# Patient Record
Sex: Female | Born: 1975 | Race: White | Hispanic: No | Smoking: Former smoker
Health system: Southern US, Community
[De-identification: ages and names within clinical notes are randomized; demographics above are authoritative.]

## PROBLEM LIST (undated history)

## (undated) DIAGNOSIS — F419 Anxiety disorder, unspecified: Secondary | ICD-10-CM

## (undated) DIAGNOSIS — S0502XA Injury of conjunctiva and corneal abrasion without foreign body, left eye, initial encounter: Secondary | ICD-10-CM

## (undated) DIAGNOSIS — F32A Depression, unspecified: Secondary | ICD-10-CM

## (undated) DIAGNOSIS — Z889 Allergy status to unspecified drugs, medicaments and biological substances status: Secondary | ICD-10-CM

## (undated) DIAGNOSIS — Z87442 Personal history of urinary calculi: Secondary | ICD-10-CM

## (undated) DIAGNOSIS — F329 Major depressive disorder, single episode, unspecified: Secondary | ICD-10-CM

## (undated) DIAGNOSIS — Z975 Presence of (intrauterine) contraceptive device: Secondary | ICD-10-CM

---
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
## 2013-08-01 ENCOUNTER — Ambulatory Visit (INDEPENDENT_AMBULATORY_CARE_PROVIDER_SITE_OTHER): Payer: BC Managed Care – PPO | Admitting: General Surgery

## 2013-08-01 ENCOUNTER — Other Ambulatory Visit (INDEPENDENT_AMBULATORY_CARE_PROVIDER_SITE_OTHER): Payer: Self-pay | Admitting: General Surgery

## 2013-08-01 ENCOUNTER — Encounter (INDEPENDENT_AMBULATORY_CARE_PROVIDER_SITE_OTHER): Payer: Self-pay | Admitting: General Surgery

## 2013-08-01 VITALS — BP 132/80 | HR 78 | Temp 98.3°F | Resp 18 | Ht 65.0 in | Wt 322.0 lb

## 2013-08-01 DIAGNOSIS — Z6841 Body Mass Index (BMI) 40.0 and over, adult: Secondary | ICD-10-CM

## 2013-08-01 NOTE — Patient Instructions (Signed)
Congratulations on starting your journey to a healthier life! Over the next few weeks you will be undergoing tests (x-rays and labs) and seeing specialists to help evaluate you for weight loss surgery.  These tests and consultations with a psychologist and nutritionist are needed to prepare you for the lifestyle changes that lie ahead and are often required by insurance companies to approve you for surgery.   Pathway to Surgery:  Over the next few weeks -->Lab work -->Radiology tests   - Chest x-ray - make sure your lungs are normal before surgery  - Upper GI - you drink barium and pictures are taken as it travels down your  esophagus and into your stomach - looks for reflux and a hiatal hernia which may  need to repaired at the same time as your weight loss surgery  - Abdominal Ultrasound - looks at your gallbladder and liver  - Mammogram - up to date mammogram if you are a female -->EKG  -->Sleep study - if you are felt to be at high risk for obstructive sleep apnea -->H. Pylori breath test ([employer]) - you surgeon may order this test to see if you have  a bacteria (H pylori) in your stomach which makes you at higher risk to develop a  ulcer or inflammation of your stomach -->Nutrition consultation -->Psychologist consultation -->Other specialist consults - your surgeon may determine that you need to see a  specialist like a cardiologist or pulmnologist depending on your health history -->Watch EMMI video about your planned weight loss surgery -->you can look at [email or web address] to learn more about weight loss surgery and  compare surgery outcomes -->you can look at our new website - [email or web address] - available mid-April 2015  Two weeks prior to surgery  Go on the extremely low carb liquid diet - this will decrease the size of your liver  which will make surgery safer - the nutritionist will go over this at a later date  Attend preoperative appointment with your surgeon  Attend  preoperative surgery class  One week prior to surgery  No aspirin products.  Tylenol is acceptable   24 hours prior to surgery  No alcoholic beverages  Report fever greater than 100.5 or excessive nasal drainage suggesting infection  Continue bariatric preop diet  Perform bowel prep if ordered  Do not eat or drink anything after midnight the night before surgery  Do not take any medications except those instructed by the anesthesiologist  Morning of surgery  Please arrive at the hospital at least 2 hours before your scheduled surgery time.  No makeup, fingernail polish or jewelry  Bring insurance cards with you  Bring your CPAP mask if you use this  

## 2013-08-01 NOTE — Progress Notes (Signed)
Patient ID: Sherry Gordon, female   DOB: 11/03/1975, 38 y.o.   MRN: [id number]  Chief Complaint  Patient presents with  . New Evaluation    new bariatric-intial consult for gastric lap band    HPI Sherry Gordon is a 38 y.o. female.   HPI 38 year old morbidly obese Caucasian female referred by Dr. Tarri Fuller for evaluation of weight loss surgery. She is specifically interested in laparoscopic adjustable gastric band surgery. She states that lab band surgery seems more organic and is not extreme as gastric bypass or sleeve. She has had friends who have had both procedures. And she feels she would do better with a lap band.  She states that she has struggled with her weight her entire life. Obesity is a problem in her family. Despite numerous attempts for sustained weight loss she has been unsuccessful. She has tried Weight Watchers off and on for over 10 years as well as Slim fast-all without any long-term success.  Her comorbidities include depression  Her paternal grandmother passed away from breast cancer. Her father had interstitial lung disease. Her sister has thyroid issues.  History reviewed. No pertinent past medical history.  Past Surgical History  Procedure Laterality Date  . Tonsillectomy  1985    History reviewed. No pertinent family history.  Social History History  Substance Use Topics  . Smoking status: Never Smoker   . Smokeless tobacco: Never Used  . Alcohol Use: Yes     Comment: occ    No Known Allergies  Current Outpatient Prescriptions  Medication Sig Dispense Refill  . cetirizine (ZYRTEC) 10 MG tablet Take 10 mg by mouth daily.      . Pseudoephedrine HCl (SUDAFED 24 HOUR PO) Take by mouth.      . venlafaxine XR (EFFEXOR-XR) 75 MG 24 hr capsule        No current facility-administered medications for this visit.    Review of Systems Review of Systems  Constitutional: Negative for fever, activity change, appetite change and unexpected weight  change.  HENT: Negative for nosebleeds and trouble swallowing.   Eyes: Negative for photophobia and visual disturbance.  Respiratory: Negative for chest tightness and shortness of breath.        Gets some wheezing with allergies and has to use inhaler  Cardiovascular: Negative for chest pain and leg swelling.       Denies CP, SOB, orthopnea, PND, DOE  Gastrointestinal: Negative for nausea, vomiting, abdominal pain, diarrhea and constipation.       Occassional reflux  Genitourinary: Negative for dysuria and difficulty urinating.       G1P1; has IUD; remote h/o kidney stone  Musculoskeletal: Negative for arthralgias.  Skin: Negative for pallor and rash.  Neurological: Negative for dizziness, seizures, facial asymmetry and numbness.       Denies TIA and amaurosis fugax   Hematological: Negative for adenopathy. Does not bruise/bleed easily.  Psychiatric/Behavioral: Negative for behavioral problems and agitation.    Blood pressure 132/80, pulse 78, temperature 98.3 F (36.8 C), temperature source Oral, resp. rate 18, height 5\' 5"  (1.651 m), weight 322 lb (146.058 kg).  Physical Exam Physical Exam  Vitals reviewed. Constitutional: She is oriented to person, place, and time. She appears well-developed and well-nourished. No distress.  Morbidly obese  HENT:  Head: Normocephalic and atraumatic.  Right Ear: External ear normal.  Left Ear: External ear normal.  Eyes: Conjunctivae are normal. No scleral icterus.  Neck: Normal range of motion. Neck supple. No tracheal deviation present. No thyromegaly  present.  Cardiovascular: Normal rate and normal heart sounds.   Pulmonary/Chest: Effort normal and breath sounds normal. No stridor. No respiratory distress. She has no wheezes.  Abdominal: Soft. She exhibits no distension. There is no tenderness. There is no rebound and no guarding.  Musculoskeletal: She exhibits no edema and no tenderness.  Lymphadenopathy:    She has no cervical  adenopathy.  Neurological: She is alert and oriented to person, place, and time. She exhibits normal muscle tone.  Skin: Skin is warm and dry. No rash noted. She is not diaphoretic. No erythema.  Psychiatric: She has a normal mood and affect. Her behavior is normal. Judgment and thought content normal.    Data Reviewed epworth sleepiness scale score 4  Assessment    Morbid obesity BMI 53.58 Depression Asthma     Plan    The patient meets weight loss surgery criteria. I think the patient would be an acceptable candidate for Laparoscopic adjustable gastric band placement.  We discussed laparoscopic adjustable gastric banding. The patient was given Agricultural engineereducational material. We discussed the risk and benefits of surgery including but not limited to bleeding, infection, injury to surrounding structures, blood clot formation such as deep venous thrombosis or pulmonary embolism, need to convert to an open procedure, band slippage, band erosion, failure to loose weight, port complications (leak or flippage), potential need for reoperative surgery, esophageal dilatation, worsening reflux, and vitamin deficiencies. We discussed the typical post operative recovery course. We discussed that their postoperative diet will be modified for several weeks. We specifically talked about the need to be on a liquid diet for one to 2 weeks after surgery. We also discussed the typical postoperative course with a laparoscopic adjustable gastric band and the need for frequent postoperative visits to assess the volume status of the band.  We discussed the typical expected weight loss with a laparoscopic adjustable gastric band. I explained to the patient that they can expect to lose 40-60% of their excess body weight if they are compliant with their postoperative instructions. However I did explain that some patients loose less than 40% and some patients lose more than 60% of their excess body weight.  I explained that the  likelihood of improvement in their obesity is good.  I explained to the patient that we will start our evaluation process which includes labs, Upper GI to evaluate stomach and swallowing anatomy, nutritionist consultation, psychiatrist consultation, EKG, CXR, abdominal ultrasound  She was given access to watch EMMI video to watch on LapBand surgery  Mary Sellaric M. Andrey CampanileWilson, MD, FACS General, Bariatric, & Minimally Invasive Surgery [facility] [place]        [facility]ILSON,Sadira Standard M 08/01/2013, 5:35 PM

## 2013-08-27 ENCOUNTER — Ambulatory Visit (HOSPITAL_COMMUNITY)
Admission: RE | Admit: 2013-08-27 | Discharge: 2013-08-27 | Disposition: A | Discharge status: Home / Self Care | Payer: BC Managed Care – PPO | Source: Ambulatory Visit | Attending: General Surgery | Admitting: General Surgery

## 2013-08-27 ENCOUNTER — Other Ambulatory Visit: Payer: Self-pay

## 2013-08-27 ENCOUNTER — Encounter (HOSPITAL_COMMUNITY)
Admission: RE | Admit: 2013-08-27 | Discharge: 2013-08-27 | Disposition: A | Discharge status: Home / Self Care | Payer: BC Managed Care – PPO | Source: Ambulatory Visit | Attending: General Surgery | Admitting: General Surgery

## 2013-08-27 DIAGNOSIS — F329 Major depressive disorder, single episode, unspecified: Secondary | ICD-10-CM | POA: Insufficient documentation

## 2013-08-27 DIAGNOSIS — Z6841 Body Mass Index (BMI) 40.0 and over, adult: Secondary | ICD-10-CM | POA: Insufficient documentation

## 2013-08-27 DIAGNOSIS — J45909 Unspecified asthma, uncomplicated: Secondary | ICD-10-CM | POA: Insufficient documentation

## 2013-08-27 DIAGNOSIS — Z803 Family history of malignant neoplasm of breast: Secondary | ICD-10-CM | POA: Insufficient documentation

## 2013-08-27 DIAGNOSIS — R935 Abnormal findings on diagnostic imaging of other abdominal regions, including retroperitoneum: Secondary | ICD-10-CM | POA: Insufficient documentation

## 2013-08-27 DIAGNOSIS — F3289 Other specified depressive episodes: Secondary | ICD-10-CM | POA: Insufficient documentation

## 2013-08-27 DIAGNOSIS — K219 Gastro-esophageal reflux disease without esophagitis: Secondary | ICD-10-CM | POA: Insufficient documentation

## 2013-09-07 ENCOUNTER — Encounter: Payer: BC Managed Care – PPO | Attending: General Surgery | Admitting: Dietician

## 2013-09-07 ENCOUNTER — Encounter: Payer: Self-pay | Admitting: Dietician

## 2013-09-07 VITALS — Ht 65.0 in | Wt 317.8 lb

## 2013-09-07 DIAGNOSIS — Z713 Dietary counseling and surveillance: Secondary | ICD-10-CM | POA: Insufficient documentation

## 2013-09-07 DIAGNOSIS — Z6841 Body Mass Index (BMI) 40.0 and over, adult: Secondary | ICD-10-CM

## 2013-09-07 DIAGNOSIS — Z01818 Encounter for other preprocedural examination: Secondary | ICD-10-CM | POA: Insufficient documentation

## 2013-09-07 NOTE — Progress Notes (Signed)
  Pre-Op Assessment Visit:  Pre-Operative LAGB Surgery  Medical Nutrition Therapy:  Appt start time: 1400   End time:  1445  Patient was seen on 09/07/2013 for Pre-Operative LAGB Nutrition Assessment. Assessment and [facility] on 09/07/2013.   Preferred Learning Style:   No preference indicated   Learning Readiness:   Ready  Handouts given during visit include:  Pre-Op Goals Bariatric Surgery Protein Shakes Bariatric fast food guide  Teaching Method Utilized:  Visual Auditory Hands on  Barriers to learning/adherence to lifestyle change: none  Demonstrated degree of understanding via:  Teach Back   Patient to call the [facility] to enroll in Pre-Op and Post-Op Nutrition Education when surgery date is scheduled.

## 2013-11-06 ENCOUNTER — Other Ambulatory Visit (INDEPENDENT_AMBULATORY_CARE_PROVIDER_SITE_OTHER): Payer: Self-pay | Admitting: General Surgery

## 2013-11-07 LAB — COMPREHENSIVE METABOLIC PANEL
ALBUMIN: 4.1 g/dL (ref 3.5–5.2)
ALK PHOS: 46 U/L (ref 39–117)
ALT: 22 U/L (ref 0–35)
AST: 18 U/L (ref 0–37)
BILIRUBIN TOTAL: 0.6 mg/dL (ref 0.2–1.2)
BUN: 13 mg/dL (ref 6–23)
CO2: 27 mEq/L (ref 19–32)
Calcium: 9.1 mg/dL (ref 8.4–10.5)
Chloride: 102 mEq/L (ref 96–112)
Creat: 0.86 mg/dL (ref 0.50–1.10)
Glucose, Bld: 146 mg/dL — ABNORMAL HIGH (ref 70–99)
POTASSIUM: 4.2 meq/L (ref 3.5–5.3)
Sodium: 138 mEq/L (ref 135–145)
Total Protein: 6.7 g/dL (ref 6.0–8.3)

## 2013-11-07 LAB — LIPID PANEL
Cholesterol: 195 mg/dL (ref 0–200)
HDL: 49 mg/dL (ref >39)
LDL Cholesterol: 120 mg/dL — ABNORMAL HIGH (ref 0–99)
Total CHOL/HDL Ratio: 4 Ratio
Triglycerides: 128 mg/dL (ref <150)
VLDL: 26 mg/dL (ref 0–40)

## 2013-11-07 LAB — CBC
HCT: 39.4 % (ref 36.0–46.0)
Hemoglobin: 14.2 g/dL (ref 12.0–15.0)
MCH: 29.9 pg (ref 26.0–34.0)
MCHC: 36 g/dL (ref 30.0–36.0)
MCV: 82.9 fL (ref 78.0–100.0)
PLATELETS: 238 10*3/uL (ref 150–400)
RBC: 4.75 MIL/uL (ref 3.87–5.11)
RDW: 13.6 % (ref 11.5–15.5)
WBC: 7 10*3/uL (ref 4.0–10.5)

## 2013-11-08 LAB — HCG, SERUM, QUALITATIVE: PREG SERUM: NEGATIVE

## 2013-11-08 LAB — TSH: TSH: 4.112 u[IU]/mL (ref 0.350–4.500)

## 2013-11-08 LAB — T4: T4 TOTAL: 10 ug/dL (ref 5.0–12.5)

## 2013-11-08 LAB — H. PYLORI ANTIBODY, IGG: H PYLORI IGG: 0.58 {ISR}

## 2013-11-18 ENCOUNTER — Encounter: Payer: BC Managed Care – PPO | Attending: General Surgery

## 2013-11-18 VITALS — Ht 65.0 in | Wt 321.0 lb

## 2013-11-18 DIAGNOSIS — Z713 Dietary counseling and surveillance: Secondary | ICD-10-CM | POA: Insufficient documentation

## 2013-11-18 DIAGNOSIS — Z01818 Encounter for other preprocedural examination: Secondary | ICD-10-CM | POA: Insufficient documentation

## 2013-11-18 DIAGNOSIS — Z6841 Body Mass Index (BMI) 40.0 and over, adult: Secondary | ICD-10-CM

## 2013-11-19 NOTE — Progress Notes (Signed)
  Pre-Operative Nutrition Class:  Appt start time: 830   End time:  1030.  Patient was seen on 11/18/2013 for Pre-Operative Bariatric Surgery Education at the [facility].   Surgery date:  Surgery type: LAGB Start weight at [facility]: 317.5 lbs on 09/07/13 Weight today: 321 lbs  TANITA  BODY COMP RESULTS  11/18/13   BMI (kg/m^2) 53.4   Fat Mass (lbs) 179.5   Fat Free Mass (lbs) 141.5   Total Body Water (lbs) 103.5   Samples given per MNT protocol. Patient educated on appropriate usage:  Premier protein shake (chocolate - qty 1)  Lot #: [id number]  Exp: 08/2014   Bariatric Advantage Calcium Citrate (Cinnamon - qty 1)  Lot #: [id number]  Exp: 11/2013   Bariactiv Multivitamin (qty 1)  Lot #: [id number]  Exp: 08/2014   Unjury protein powder (strawberry - qty 1)  Lot #: [id number]  Exp: 12/2014  The following the learning objectives were met by the patient during this course:  Identify Pre-Op Dietary Goals and will begin 2 weeks pre-operatively  Identify appropriate sources of fluids and proteins   State protein recommendations and appropriate sources pre and post-operatively  Identify Post-Operative Dietary Goals and will follow for 2 weeks post-operatively  Identify appropriate multivitamin and calcium sources  Describe the need for physical activity post-operatively and will follow MD recommendations  State when to call healthcare provider regarding medication questions or post-operative complications  Handouts given during class include:  Pre-Op Bariatric Surgery Diet Handout  Protein Shake Handout  Post-Op Bariatric Surgery Nutrition Handout  BELT Program Information Flyer  Support Group Information Flyer  [facility] Bariatric Supplements Price List  Follow-Up Plan: Patient will [facility]-up at [facility] 2 weeks post operatively for diet advancement per MD.

## 2013-11-20 ENCOUNTER — Other Ambulatory Visit (INDEPENDENT_AMBULATORY_CARE_PROVIDER_SITE_OTHER): Payer: Self-pay | Admitting: General Surgery

## 2013-11-20 ENCOUNTER — Telehealth (INDEPENDENT_AMBULATORY_CARE_PROVIDER_SITE_OTHER): Payer: Self-pay | Admitting: General Surgery

## 2013-11-20 NOTE — Telephone Encounter (Signed)
LMOM to let her know that she has a pre-op apt with Dr. Andrey CampanileWilson on 11-27-13 @ 12;00 and her 1st post op apt is on 12/27/2013. Card as been mailed to patient home

## 2013-11-25 ENCOUNTER — Encounter (HOSPITAL_COMMUNITY): Payer: Self-pay | Admitting: Pharmacy Technician

## 2013-11-26 ENCOUNTER — Encounter (HOSPITAL_COMMUNITY)
Admission: RE | Admit: 2013-11-26 | Discharge: 2013-11-26 | Disposition: A | Discharge status: Home / Self Care | Payer: BC Managed Care – PPO | Source: Ambulatory Visit | Attending: General Surgery | Admitting: General Surgery

## 2013-11-26 ENCOUNTER — Encounter (HOSPITAL_COMMUNITY): Payer: Self-pay

## 2013-11-26 DIAGNOSIS — Z01812 Encounter for preprocedural laboratory examination: Secondary | ICD-10-CM | POA: Insufficient documentation

## 2013-11-26 DIAGNOSIS — Z01818 Encounter for other preprocedural examination: Secondary | ICD-10-CM | POA: Insufficient documentation

## 2013-11-26 HISTORY — DX: Presence of (intrauterine) contraceptive device: Z97.5

## 2013-11-26 HISTORY — DX: Major depressive disorder, single episode, unspecified: F32.9

## 2013-11-26 HISTORY — DX: Depression, unspecified: F32.A

## 2013-11-26 HISTORY — DX: Injury of conjunctiva and corneal abrasion without foreign body, left eye, initial encounter: S05.02XA

## 2013-11-26 HISTORY — DX: Personal history of urinary calculi: Z87.442

## 2013-11-26 HISTORY — DX: Allergy status to unspecified drugs, medicaments and biological substances: Z88.9

## 2013-11-26 HISTORY — DX: Anxiety disorder, unspecified: F41.9

## 2013-11-26 LAB — CBC WITH DIFFERENTIAL/PLATELET
BASOS PCT: 1 % (ref 0–1)
Basophils Absolute: 0.1 10*3/uL (ref 0.0–0.1)
Eosinophils Absolute: 0.2 10*3/uL (ref 0.0–0.7)
Eosinophils Relative: 2 % (ref 0–5)
HEMATOCRIT: 41.3 % (ref 36.0–46.0)
Hemoglobin: 14.2 g/dL (ref 12.0–15.0)
LYMPHS PCT: 38 % (ref 12–46)
Lymphs Abs: 3.2 10*3/uL (ref 0.7–4.0)
MCH: 29.8 pg (ref 26.0–34.0)
MCHC: 34.4 g/dL (ref 30.0–36.0)
MCV: 86.8 fL (ref 78.0–100.0)
Monocytes Absolute: 0.6 10*3/uL (ref 0.1–1.0)
Monocytes Relative: 7 % (ref 3–12)
NEUTROS PCT: 52 % (ref 43–77)
Neutro Abs: 4.3 10*3/uL (ref 1.7–7.7)
PLATELETS: 247 10*3/uL (ref 150–400)
RBC: 4.76 MIL/uL (ref 3.87–5.11)
RDW: 12.3 % (ref 11.5–15.5)
WBC: 8.3 10*3/uL (ref 4.0–10.5)

## 2013-11-26 LAB — COMPREHENSIVE METABOLIC PANEL
ALBUMIN: 4 g/dL (ref 3.5–5.2)
ALK PHOS: 50 U/L (ref 39–117)
ALT: 49 U/L — AB (ref 0–35)
AST: 42 U/L — AB (ref 0–37)
Anion gap: 13 (ref 5–15)
BILIRUBIN TOTAL: 0.4 mg/dL (ref 0.3–1.2)
BUN: 19 mg/dL (ref 6–23)
CO2: 27 meq/L (ref 19–32)
CREATININE: 0.88 mg/dL (ref 0.50–1.10)
Calcium: 10.1 mg/dL (ref 8.4–10.5)
Chloride: 99 mEq/L (ref 96–112)
GFR calc Af Amer: >90 mL/min (ref >90)
GFR, EST NON AFRICAN AMERICAN: 84 mL/min — AB (ref >90)
Glucose, Bld: 86 mg/dL (ref 70–99)
POTASSIUM: 4.2 meq/L (ref 3.7–5.3)
SODIUM: 139 meq/L (ref 137–147)
Total Protein: 7.5 g/dL (ref 6.0–8.3)

## 2013-11-26 NOTE — Pre-Procedure Instructions (Signed)
11-26-13 EKG/ CXR 08-27-13 Epic.

## 2013-11-26 NOTE — Patient Instructions (Addendum)
20 Sherry Gordon  11/26/2013   Your procedure is scheduled on: 8-4  -2015 Tuesday at 0715 AM.  Enter through [facility] Entrance and follow signs to Short Stay Center. Arrive at   0515     AM.  Call this number if you have problems the morning of surgery: [phone]  Or Presurgical Testing [phone](Miasha Emmons) For Living Will and/or Health Care Power Attorney Forms: please provide copy for your medical record,may bring AM of surgery(Forms should be already notarized -we do not provide this service).(11-26-13 No- yes information preferred today and given).     Do not eat food:After Midnight.    Take these medicines the morning of surgery with A SIP OF WATER: Effexor. Cetirizine.   Do not wear jewelry, make-up or nail polish.  Do not wear lotions, powders, or perfumes. You may wear deodorant.  Do not shave 48 hours(2 days) prior to first CHG shower(legs and under arms).(Shaving face and neck okay.)  Do not bring valuables to the hospital.(Hospital is not responsible for lost valuables).  Contacts, dentures or removable bridgework, body piercing, hair pins may not be worn into surgery.  Leave suitcase in the car. After surgery it may be brought to your room.  For patients admitted to the hospital, checkout time is 11:00 AM the day of discharge.(Restricted visitors-Any Persons displaying flu-like symptoms or illness).    Patients discharged the day of surgery will not be allowed to drive home. Must have responsible person with you x 24 hours once discharged.  Name and phone number of your driver: Larose Hires [phone] cell  Special Instructions: CHG(Chlorhedine 4%-"Hibiclens","Betasept","Aplicare") Shower Use Special Wash: see special instructions.(avoid face and genitals)       ----------------------------------------------------------[facility] - Preparing for Surgery Before surgery, you can play an important role.  Because skin is not sterile, your skin needs to be as  free of germs as possible.  You can reduce the number of germs on your skin by washing with CHG (chlorahexidine gluconate) soap before surgery.  CHG is an antiseptic cleaner which kills germs and bonds with the skin to continue killing germs even after washing. Please DO NOT use if you have an allergy to CHG or antibacterial soaps.  If your skin becomes reddened/irritated stop using the CHG and inform your nurse when you arrive at Short Stay. Do not shave (including legs and underarms) for at least 48 hours prior to the first CHG shower.  You may shave your face/neck. Please follow these instructions carefully:  1.  Shower with CHG Soap the night before surgery and the  morning of Surgery.  2.  If you choose to wash your hair, wash your hair first as usual with your  normal  shampoo.  3.  After you shampoo, rinse your hair and body thoroughly to remove the  shampoo.                           4.  Use CHG as you would any other liquid soap.  You can apply chg directly  to the skin and wash                       Gently with a scrungie or clean washcloth.  5.  Apply the CHG Soap to your body ONLY FROM THE NECK DOWN.   Do not use on face/ open  Wound or open sores. Avoid contact with eyes, ears mouth and genitals (private parts).                       Wash face,  Genitals (private parts) with your normal soap.             6.  Wash thoroughly, paying special attention to the area where your surgery  will be performed.  7.  Thoroughly rinse your body with warm water from the neck down.  8.  DO NOT shower/wash with your normal soap after using and rinsing off  the CHG Soap.                9.  Pat yourself dry with a clean towel.            10.  Wear clean pajamas.            11.  Place clean sheets on your bed the night of your first shower and do not  sleep with pets. Day of Surgery : Do not apply any lotions/deodorants the morning of surgery.  Please wear clean clothes to the  hospital/surgery center.  FAILURE TO FOLLOW THESE INSTRUCTIONS MAY RESULT IN THE CANCELLATION OF YOUR SURGERY PATIENT SIGNATURE_________________________________  NURSE SIGNATURE__________________________________  ________________________________________________________________________

## 2013-11-27 ENCOUNTER — Ambulatory Visit (INDEPENDENT_AMBULATORY_CARE_PROVIDER_SITE_OTHER): Payer: BC Managed Care – PPO | Admitting: General Surgery

## 2013-11-27 ENCOUNTER — Encounter (INDEPENDENT_AMBULATORY_CARE_PROVIDER_SITE_OTHER): Payer: Self-pay | Admitting: General Surgery

## 2013-11-27 VITALS — BP 129/88 | HR 80 | Temp 98.5°F | Resp 17 | Ht 65.0 in | Wt 317.8 lb

## 2013-11-27 DIAGNOSIS — Z6841 Body Mass Index (BMI) 40.0 and over, adult: Secondary | ICD-10-CM

## 2013-11-27 MED ORDER — OXYCODONE HCL 5 MG/5ML PO SOLN: 5.0000 mg | ORAL | Status: DC | PRN

## 2013-11-27 NOTE — Patient Instructions (Signed)
Keep up with the preop diet - it is designed to shrink your liver Watch the educational videos Walk daily  google - covidien bariatrics   Two weeks prior to surgery  Go on the extremely low carb liquid diet - this will decrease the size of your liver  which will make surgery safer - the nutritionist will go over this at a later date  Attend preoperative appointment with your surgeon  Attend preoperative surgery class  One week prior to surgery  No aspirin products.  Tylenol is acceptable   24 hours prior to surgery  No alcoholic beverages  Report fever greater than 100.5 or excessive nasal drainage suggesting infection  Continue bariatric preop diet  Perform bowel prep if ordered  Do not eat or drink anything after midnight the night before surgery  Do not take any medications except those instructed by the anesthesiologist  Morning of surgery  Please arrive at the hospital at least 2 hours before your scheduled surgery time.  No makeup, fingernail polish or jewelry  Bring insurance cards with you  Bring your CPAP mask if you use this

## 2013-11-29 NOTE — Progress Notes (Signed)
Patient ID: Sherry Gordon, female   DOB: [birth year], 38 y.o.   MRN: [id number]  Chief Complaint  Patient presents with  . Bariatric Pre-op    HPI Sherry Gordon is a 38 y.o. female.   HPI 38 year old morbidly obese Caucasian female comes in for her preoperative appointment. I initially met her in April 2015. Her weight at that time was 322 pounds. She has completed her preoperative evaluation and has been approved for laparoscopic adjustable gastric band. She denies any changes to her medical history since she was seen in April. She has been walking in the evenings. she has been using the bariatricpal smartphone app.  Past Medical History  Diagnosis Date  . IUD (intrauterine device) in place     11-26-13 Mirena  . H/O seasonal allergies     carries an Inhaler, never used - uses OTC cetirizine  . Depression   . Anxiety   . History of kidney stones     x2 no recent problems  . Corneal abrasion, left     "corneal scatch" not a problem in last 6 months    Past Surgical History  Procedure Laterality Date  . Tonsillectomy  1985    age 70    History reviewed. No pertinent family history.  Social History History  Substance Use Topics  . Smoking status: Former Smoker -- 0.50 packs/day for 5 years    Quit date: 11/26/2008  . Smokeless tobacco: Never Used  . Alcohol Use: Yes     Comment: occ    No Known Allergies  Current Outpatient Prescriptions  Medication Sig Dispense Refill  . cetirizine (ZYRTEC) 10 MG tablet Take 10 mg by mouth daily.      Marland Kitchen venlafaxine XR (EFFEXOR-XR) 75 MG 24 hr capsule Take 75 mg by mouth every morning.       Marland Kitchen oxyCODONE (ROXICODONE) 5 MG/5ML solution Take 5-10 mLs (5-10 mg total) by mouth every 4 (four) hours as needed for severe pain.  200 mL  0   No current facility-administered medications for this visit.    Review of Systems Review of Systems  Constitutional: Negative for fever, activity change, appetite change and unexpected weight change.   HENT: Negative for nosebleeds and trouble swallowing.   Eyes: Negative for photophobia and visual disturbance.  Respiratory: Negative for chest tightness and shortness of breath.   Cardiovascular: Negative for chest pain and leg swelling.       Denies CP, SOB, orthopnea, PND, DOE  Gastrointestinal: Negative for nausea, abdominal pain, diarrhea, constipation and blood in stool.       Occasional reflux  Genitourinary: Negative for dysuria and difficulty urinating.  Musculoskeletal: Negative for arthralgias.  Skin: Negative for pallor and rash.  Neurological: Negative for dizziness, seizures, facial asymmetry and numbness.          Hematological: Negative for adenopathy. Does not bruise/bleed easily.  Psychiatric/Behavioral: Negative for behavioral problems and agitation.    Blood pressure 129/88, pulse 80, temperature 98.5 F (36.9 C), temperature source Oral, resp. rate 17, height 5' 5"  (1.651 m), weight 317 lb 12.8 oz (144.153 kg).  Physical Exam Physical Exam  Vitals reviewed. Constitutional: She is oriented to person, place, and time. She appears well-developed and well-nourished. No distress.  Morbidly obese  HENT:  Head: Normocephalic and atraumatic.  Right Ear: External ear normal.  Left Ear: External ear normal.  Eyes: Conjunctivae are normal. No scleral icterus.  Neck: Normal range of motion. Neck supple. No tracheal deviation present. No thyromegaly present.  Cardiovascular: Normal rate and normal heart sounds.   Pulmonary/Chest: Effort normal and breath sounds normal. No stridor. No respiratory distress. She has no wheezes.  Abdominal: Soft. She exhibits no distension. There is no tenderness. There is no rebound and no guarding.  Musculoskeletal: She exhibits no edema and no tenderness.  Lymphadenopathy:    She has no cervical adenopathy.  Neurological: She is alert and oriented to person, place, and time. She exhibits normal muscle tone.  Skin: Skin is warm and dry.  No rash noted. She is not diaphoretic. No erythema.  Psychiatric: She has a normal mood and affect. Her behavior is normal. Judgment and thought content normal.    Data Reviewed My office note Upper GI-small reflux Chest x-ray-within normal limits Abdominal ultrasound-hepatic steatosis bariatric surgery evaluation labs-fasting blood glucose level 146, LDL level 120  Assessment    Morbid obesity BMI 52.88 Depression Asthma Impaired fasting glucose Elevated LDL     Plan    We reviewed her preoperative workup. We discussed the finding of her blood work. She was allowed to view the video on the intraoperative steps placement of a laparoscopic adjustable gastric band. We discussed the typical hospital course as well as the typical postoperative course.  I stressed the importance of the preoperative diet. I encouraged her to continue to walk on a daily basis. She was given access to watch the EMMI video on postop LapBand.   Note: This dictation was prepared with Dragon/digital dictation along with [employer]. Any transcriptional errors that result from this process are unintentional.  Leighton Ruff. Redmond Pulling, MD, FACS General, Bariatric, & Minimally Invasive Surgery [facility], [place]        [facility] M 11/29/2013, 5:52 AM

## 2013-12-02 NOTE — Anesthesia Preprocedure Evaluation (Addendum)
Anesthesia Evaluation  Patient identified by MRN, date of birth, ID band Patient awake    Reviewed: Allergy & Precautions, H&P , NPO status , Patient's Chart, lab work & pertinent test results  Airway Mallampati: II TM Distance: >3 FB Neck ROM: full    Dental  (+) Caps, Dental Advisory Given Left upper front is capped:   Pulmonary neg pulmonary ROS, former smoker,  breath sounds clear to auscultation  Pulmonary exam normal       Cardiovascular Exercise Tolerance: Good negative cardio ROS  Rhythm:regular Rate:Normal     Neuro/Psych negative neurological ROS  negative psych ROS   GI/Hepatic negative GI ROS, Neg liver ROS,   Endo/Other  negative endocrine ROSMorbid obesity  Renal/GU negative Renal ROS  negative genitourinary   Musculoskeletal   Abdominal (+) + obese,   Peds  Hematology negative hematology ROS (+)   Anesthesia Other Findings   Reproductive/Obstetrics negative OB ROS                         Anesthesia Physical Anesthesia Plan  ASA: III  Anesthesia Plan: General   Post-op Pain Management:    Induction: Intravenous  Airway Management Planned: Oral ETT  Additional Equipment:   Intra-op Plan:   Post-operative Plan: Extubation in OR  Informed Consent: I have reviewed the patients History and Physical, chart, labs and discussed the procedure including the risks, benefits and alternatives for the proposed anesthesia with the patient or authorized representative who has indicated his/her understanding and acceptance.   Dental Advisory Given  Plan Discussed with: CRNA and Surgeon  Anesthesia Plan Comments:        Anesthesia Quick Evaluation

## 2013-12-03 ENCOUNTER — Encounter (HOSPITAL_COMMUNITY): Payer: Self-pay | Admitting: *Deleted

## 2013-12-03 ENCOUNTER — Encounter (HOSPITAL_COMMUNITY): Payer: BC Managed Care – PPO | Admitting: Anesthesiology

## 2013-12-03 ENCOUNTER — Encounter (HOSPITAL_COMMUNITY)
Admission: RE | Disposition: A | Discharge status: Home / Self Care | Payer: Self-pay | Source: Ambulatory Visit | Attending: General Surgery

## 2013-12-03 ENCOUNTER — Inpatient Hospital Stay (HOSPITAL_COMMUNITY): Payer: BC Managed Care – PPO | Admitting: Anesthesiology

## 2013-12-03 ENCOUNTER — Inpatient Hospital Stay (HOSPITAL_COMMUNITY): Payer: BC Managed Care – PPO

## 2013-12-03 ENCOUNTER — Ambulatory Visit (HOSPITAL_COMMUNITY)
Admission: RE | Admit: 2013-12-03 | Discharge: 2013-12-03 | Disposition: A | Discharge status: Home / Self Care | Payer: BC Managed Care – PPO | Source: Ambulatory Visit | Attending: General Surgery | Admitting: General Surgery

## 2013-12-03 DIAGNOSIS — Z6841 Body Mass Index (BMI) 40.0 and over, adult: Secondary | ICD-10-CM | POA: Insufficient documentation

## 2013-12-03 DIAGNOSIS — F329 Major depressive disorder, single episode, unspecified: Secondary | ICD-10-CM | POA: Insufficient documentation

## 2013-12-03 DIAGNOSIS — Z79899 Other long term (current) drug therapy: Secondary | ICD-10-CM | POA: Insufficient documentation

## 2013-12-03 DIAGNOSIS — J45909 Unspecified asthma, uncomplicated: Secondary | ICD-10-CM | POA: Insufficient documentation

## 2013-12-03 DIAGNOSIS — F3289 Other specified depressive episodes: Secondary | ICD-10-CM | POA: Insufficient documentation

## 2013-12-03 DIAGNOSIS — R7301 Impaired fasting glucose: Secondary | ICD-10-CM | POA: Insufficient documentation

## 2013-12-03 DIAGNOSIS — E7800 Pure hypercholesterolemia, unspecified: Secondary | ICD-10-CM | POA: Insufficient documentation

## 2013-12-03 DIAGNOSIS — Z87891 Personal history of nicotine dependence: Secondary | ICD-10-CM | POA: Insufficient documentation

## 2013-12-03 DIAGNOSIS — Z87442 Personal history of urinary calculi: Secondary | ICD-10-CM | POA: Insufficient documentation

## 2013-12-03 DIAGNOSIS — Z975 Presence of (intrauterine) contraceptive device: Secondary | ICD-10-CM | POA: Insufficient documentation

## 2013-12-03 HISTORY — PX: LAPAROSCOPIC GASTRIC BANDING: SHX1100

## 2013-12-03 LAB — PREGNANCY, URINE: PREG TEST UR: NEGATIVE

## 2013-12-03 SURGERY — GASTRIC BANDING, LAPAROSCOPIC
Anesthesia: General | Site: Abdomen

## 2013-12-03 MED ORDER — PROPOFOL 10 MG/ML IV BOLUS
INTRAVENOUS | Status: AC
  Filled 2013-12-03: qty 20

## 2013-12-03 MED ORDER — LACTATED RINGERS IV SOLN
INTRAVENOUS | Status: DC | PRN
  Administered 2013-12-03: 06:00:00 via INTRAVENOUS

## 2013-12-03 MED ORDER — SODIUM CHLORIDE 0.9 % IJ SOLN
INTRAMUSCULAR | Status: AC
  Filled 2013-12-03: qty 20

## 2013-12-03 MED ORDER — NEOSTIGMINE METHYLSULFATE 10 MG/10ML IV SOLN
INTRAVENOUS | Status: DC | PRN
  Administered 2013-12-03: 5 mg via INTRAVENOUS

## 2013-12-03 MED ORDER — MIDAZOLAM HCL 2 MG/2ML IJ SOLN
INTRAMUSCULAR | Status: AC
  Filled 2013-12-03: qty 2

## 2013-12-03 MED ORDER — LIDOCAINE HCL (CARDIAC) 20 MG/ML IV SOLN
INTRAVENOUS | Status: DC | PRN
  Administered 2013-12-03: 100 mg via INTRAVENOUS

## 2013-12-03 MED ORDER — MORPHINE SULFATE 10 MG/ML IJ SOLN: 2.0000 mg | INTRAMUSCULAR | Status: DC | PRN

## 2013-12-03 MED ORDER — ONDANSETRON HCL 4 MG/2ML IJ SOLN: 4.0000 mg | INTRAMUSCULAR | Status: DC | PRN

## 2013-12-03 MED ORDER — SODIUM CHLORIDE 0.9 % IJ SOLN
INTRAMUSCULAR | Status: DC | PRN
  Administered 2013-12-03: 20 mL

## 2013-12-03 MED ORDER — SUFENTANIL CITRATE 50 MCG/ML IV SOLN
INTRAVENOUS | Status: AC
  Filled 2013-12-03: qty 1

## 2013-12-03 MED ORDER — GLYCOPYRROLATE 0.2 MG/ML IJ SOLN
INTRAMUSCULAR | Status: AC
  Filled 2013-12-03: qty 3

## 2013-12-03 MED ORDER — CHLORHEXIDINE GLUCONATE 4 % EX LIQD: 60.0000 mL | Freq: Once | CUTANEOUS | Status: DC

## 2013-12-03 MED ORDER — DEXTROSE 5 % IV SOLN
INTRAVENOUS | Status: AC
  Filled 2013-12-03: qty 2

## 2013-12-03 MED ORDER — ACETAMINOPHEN 160 MG/5ML PO SOLN
325.0000 mg | ORAL | Status: DC | PRN
  Filled 2013-12-03: qty 20.3

## 2013-12-03 MED ORDER — BUPIVACAINE-EPINEPHRINE 0.25% -1:200000 IJ SOLN
INTRAMUSCULAR | Status: DC | PRN
  Administered 2013-12-03: 30 mL

## 2013-12-03 MED ORDER — HYDROMORPHONE HCL PF 1 MG/ML IJ SOLN
INTRAMUSCULAR | Status: AC
  Filled 2013-12-03: qty 1

## 2013-12-03 MED ORDER — BUPIVACAINE-EPINEPHRINE (PF) 0.25% -1:200000 IJ SOLN
INTRAMUSCULAR | Status: AC
  Filled 2013-12-03: qty 30

## 2013-12-03 MED ORDER — NEOSTIGMINE METHYLSULFATE 10 MG/10ML IV SOLN
INTRAVENOUS | Status: AC
  Filled 2013-12-03: qty 1

## 2013-12-03 MED ORDER — OXYCODONE HCL 5 MG/5ML PO SOLN: 5.0000 mg | ORAL | Status: DC | PRN

## 2013-12-03 MED ORDER — LIDOCAINE HCL (CARDIAC) 20 MG/ML IV SOLN
INTRAVENOUS | Status: AC
  Filled 2013-12-03: qty 5

## 2013-12-03 MED ORDER — POTASSIUM CHLORIDE IN NACL 20-0.9 MEQ/L-% IV SOLN: INTRAVENOUS | Status: DC

## 2013-12-03 MED ORDER — CISATRACURIUM BESYLATE 20 MG/10ML IV SOLN
INTRAVENOUS | Status: AC
  Filled 2013-12-03: qty 10

## 2013-12-03 MED ORDER — UNJURY VANILLA POWDER
2.0000 [oz_av] | Freq: Four times a day (QID) | ORAL | Status: DC
  Filled 2013-12-03 (×4): qty 27

## 2013-12-03 MED ORDER — DEXTROSE 5 % IV SOLN
2.0000 g | INTRAVENOUS | Status: AC
  Administered 2013-12-03: 2 g via INTRAVENOUS

## 2013-12-03 MED ORDER — MIDAZOLAM HCL 5 MG/5ML IJ SOLN
INTRAMUSCULAR | Status: DC | PRN
  Administered 2013-12-03: 2 mg via INTRAVENOUS

## 2013-12-03 MED ORDER — UNJURY CHOCOLATE CLASSIC POWDER
2.0000 [oz_av] | Freq: Four times a day (QID) | ORAL | Status: DC
  Filled 2013-12-03 (×4): qty 27

## 2013-12-03 MED ORDER — GLYCOPYRROLATE 0.2 MG/ML IJ SOLN
INTRAMUSCULAR | Status: DC | PRN
  Administered 2013-12-03: .8 mg via INTRAVENOUS

## 2013-12-03 MED ORDER — SUCCINYLCHOLINE CHLORIDE 20 MG/ML IJ SOLN
INTRAMUSCULAR | Status: DC | PRN
  Administered 2013-12-03: 100 mg via INTRAVENOUS

## 2013-12-03 MED ORDER — CISATRACURIUM BESYLATE (PF) 10 MG/5ML IV SOLN
INTRAVENOUS | Status: DC | PRN
  Administered 2013-12-03: 14 mg via INTRAVENOUS

## 2013-12-03 MED ORDER — LACTATED RINGERS IV SOLN
INTRAVENOUS | Status: DC | PRN
  Administered 2013-12-03: 1000 mL

## 2013-12-03 MED ORDER — LACTATED RINGERS IV SOLN
INTRAVENOUS | Status: DC
  Administered 2013-12-03: 10:00:00 via INTRAVENOUS

## 2013-12-03 MED ORDER — SODIUM CHLORIDE 0.9 % IJ SOLN
INTRAMUSCULAR | Status: AC
  Filled 2013-12-03: qty 10

## 2013-12-03 MED ORDER — UNJURY CHICKEN SOUP POWDER
2.0000 [oz_av] | Freq: Four times a day (QID) | ORAL | Status: DC
  Filled 2013-12-03 (×4): qty 27

## 2013-12-03 MED ORDER — HEPARIN SODIUM (PORCINE) 5000 UNIT/ML IJ SOLN
5000.0000 [IU] | INTRAMUSCULAR | Status: AC
  Administered 2013-12-03: 5000 [IU] via SUBCUTANEOUS
  Filled 2013-12-03: qty 1

## 2013-12-03 MED ORDER — SUFENTANIL CITRATE 50 MCG/ML IV SOLN
INTRAVENOUS | Status: DC | PRN
  Administered 2013-12-03: 5 ug via INTRAVENOUS
  Administered 2013-12-03: 10 ug via INTRAVENOUS
  Administered 2013-12-03: 5 ug via INTRAVENOUS
  Administered 2013-12-03: 10 ug via INTRAVENOUS

## 2013-12-03 MED ORDER — ACETAMINOPHEN 160 MG/5ML PO SOLN
650.0000 mg | ORAL | Status: DC | PRN
  Administered 2013-12-03: 650 mg via ORAL
  Filled 2013-12-03 (×2): qty 20.3

## 2013-12-03 MED ORDER — HYDROMORPHONE HCL PF 1 MG/ML IJ SOLN
0.2500 mg | INTRAMUSCULAR | Status: DC | PRN
  Administered 2013-12-03 (×2): 0.25 mg via INTRAVENOUS

## 2013-12-03 MED ORDER — PROPOFOL 10 MG/ML IV BOLUS
INTRAVENOUS | Status: DC | PRN
  Administered 2013-12-03: 200 mg via INTRAVENOUS

## 2013-12-03 MED ORDER — ONDANSETRON HCL 4 MG/2ML IJ SOLN
INTRAMUSCULAR | Status: DC | PRN
  Administered 2013-12-03: 4 mg via INTRAVENOUS

## 2013-12-03 MED ORDER — EPHEDRINE SULFATE 50 MG/ML IJ SOLN
INTRAMUSCULAR | Status: DC | PRN
  Administered 2013-12-03: 10 mg via INTRAVENOUS

## 2013-12-03 MED ORDER — ONDANSETRON HCL 4 MG/2ML IJ SOLN
INTRAMUSCULAR | Status: AC
  Filled 2013-12-03: qty 2

## 2013-12-03 SURGICAL SUPPLY — 57 items
BAND LAP 10.0 W/TUBES (Band) ×2 IMPLANT
BAND LAP 10.0CM W/TUBES (Band) ×1 IMPLANT
BENZOIN TINCTURE PRP APPL 2/3 (GAUZE/BANDAGES/DRESSINGS) IMPLANT
BLADE HEX COATED 2.75 (ELECTRODE) ×3 IMPLANT
BLADE SURG 15 STRL LF DISP TIS (BLADE) ×1 IMPLANT
BLADE SURG 15 STRL SS (BLADE) ×2
BLADE SURG SZ11 CARB STEEL (BLADE) ×3 IMPLANT
CANISTER SUCTION 2500CC (MISCELLANEOUS) IMPLANT
CHLORAPREP W/TINT 26ML (MISCELLANEOUS) ×3 IMPLANT
DECANTER SPIKE VIAL GLASS SM (MISCELLANEOUS) ×3 IMPLANT
DERMABOND ADVANCED (GAUZE/BANDAGES/DRESSINGS)
DERMABOND ADVANCED .7 DNX12 (GAUZE/BANDAGES/DRESSINGS) IMPLANT
DEVICE SUT QUICK LOAD TK 5 (STAPLE) ×6 IMPLANT
DEVICE SUT TI-KNOT TK 5X26 (MISCELLANEOUS) ×2 IMPLANT
DEVICE SUTURE ENDOST 10MM (ENDOMECHANICALS) IMPLANT
DEVICE TI KNOT TK5 (MISCELLANEOUS) ×1
DISSECTOR BLUNT TIP ENDO 5MM (MISCELLANEOUS) IMPLANT
DRAPE CAMERA CLOSED 9X96 (DRAPES) ×3 IMPLANT
DRAPE UTILITY XL STRL (DRAPES) ×6 IMPLANT
ELECT REM PT RETURN 9FT ADLT (ELECTROSURGICAL) ×3
ELECTRODE REM PT RTRN 9FT ADLT (ELECTROSURGICAL) ×1 IMPLANT
GLOVE BIO SURGEON STRL SZ7.5 (GLOVE) ×3 IMPLANT
GLOVE BIOGEL M STRL SZ7.5 (GLOVE) IMPLANT
GLOVE INDICATOR 8.0 STRL GRN (GLOVE) ×3 IMPLANT
GOWN STRL REUS W/TWL XL LVL3 (GOWN DISPOSABLE) ×9 IMPLANT
HOVERMATT SINGLE USE (MISCELLANEOUS) ×3 IMPLANT
KIT BASIN OR (CUSTOM PROCEDURE TRAY) ×3 IMPLANT
MESH HERNIA 1X4 RECT BARD (Mesh General) ×1 IMPLANT
MESH HERNIA BARD 1X4 (Mesh General) ×2 IMPLANT
NEEDLE SPNL 22GX3.5 QUINCKE BK (NEEDLE) ×3 IMPLANT
NS IRRIG 1000ML POUR BTL (IV SOLUTION) ×3 IMPLANT
PACK UNIVERSAL I (CUSTOM PROCEDURE TRAY) ×3 IMPLANT
PENCIL BUTTON HOLSTER BLD 10FT (ELECTRODE) ×3 IMPLANT
QUICK LOAD TK 5 (STAPLE) ×3
SET IRRIG TUBING LAPAROSCOPIC (IRRIGATION / IRRIGATOR) IMPLANT
SHEARS HARMONIC ACE PLUS 36CM (ENDOMECHANICALS) IMPLANT
SLEEVE XCEL OPT CAN 5 100 (ENDOMECHANICALS) ×6 IMPLANT
SOLUTION ANTI FOG 6CC (MISCELLANEOUS) ×3 IMPLANT
SPONGE LAP 18X18 X RAY DECT (DISPOSABLE) ×3 IMPLANT
STAPLER VISISTAT 35W (STAPLE) IMPLANT
SUT ETHIBOND 2 0 SH (SUTURE) ×6
SUT ETHIBOND 2 0 SH 36X2 (SUTURE) ×3 IMPLANT
SUT MNCRL AB 4-0 PS2 18 (SUTURE) ×3 IMPLANT
SUT PROLENE 2 0 CT2 30 (SUTURE) ×3 IMPLANT
SUT SILK 0 (SUTURE) ×2
SUT SILK 0 30XBRD TIE 6 (SUTURE) ×1 IMPLANT
SUT SURGIDAC NAB ES-9 0 48 120 (SUTURE) IMPLANT
SUT VIC AB 2-0 SH 27 (SUTURE) ×2
SUT VIC AB 2-0 SH 27X BRD (SUTURE) ×1 IMPLANT
SUT VIC AB 3-0 SH 27 (SUTURE) ×2
SUT VIC AB 3-0 SH 27X BRD (SUTURE) ×1 IMPLANT
SYRINGE 20CC LL (MISCELLANEOUS) ×6 IMPLANT
TOWEL OR 17X26 10 PK STRL BLUE (TOWEL DISPOSABLE) ×3 IMPLANT
TROCAR BLADELESS 15MM (ENDOMECHANICALS) ×3 IMPLANT
TROCAR BLADELESS OPT 5 100 (ENDOMECHANICALS) ×3 IMPLANT
TUBE CALIBRATION LAPBAND (TUBING) ×3 IMPLANT
TUBING INSUFFLATION 10FT LAP (TUBING) ×3 IMPLANT

## 2013-12-03 NOTE — Progress Notes (Addendum)
Up to recliner chair from stretcher post op gastric band. PAS intact. Pt practicing deep breathing with splinting.   1350  Patient ambulated around unit twice and tolerated well. Waiting on dietician to visit patient prior to discharge.

## 2013-12-03 NOTE — H&P (View-Only) (Signed)
Patient ID: Sherry Gordon, female   DOB: [birth year], 38 y.o.   MRN: [id number]  Chief Complaint  Patient presents with  . Bariatric Pre-op    HPI Sherry Gordon is a 38 y.o. female.   HPI 38 year old morbidly obese Caucasian female comes in for her preoperative appointment. I initially met her in April 2015. Her weight at that time was 322 pounds. She has completed her preoperative evaluation and has been approved for laparoscopic adjustable gastric band. She denies any changes to her medical history since she was seen in April. She has been walking in the evenings. she has been using the bariatricpal smartphone app.  Past Medical History  Diagnosis Date  . IUD (intrauterine device) in place     11-26-13 Mirena  . H/O seasonal allergies     carries an Inhaler, never used - uses OTC cetirizine  . Depression   . Anxiety   . History of kidney stones     x2 no recent problems  . Corneal abrasion, left     "corneal scatch" not a problem in last 6 months    Past Surgical History  Procedure Laterality Date  . Tonsillectomy  1985    age 78    History reviewed. No pertinent family history.  Social History History  Substance Use Topics  . Smoking status: Former Smoker -- 0.50 packs/day for 5 years    Quit date: 11/26/2008  . Smokeless tobacco: Never Used  . Alcohol Use: Yes     Comment: occ    No Known Allergies  Current Outpatient Prescriptions  Medication Sig Dispense Refill  . cetirizine (ZYRTEC) 10 MG tablet Take 10 mg by mouth daily.      Marland Kitchen venlafaxine XR (EFFEXOR-XR) 75 MG 24 hr capsule Take 75 mg by mouth every morning.       Marland Kitchen oxyCODONE (ROXICODONE) 5 MG/5ML solution Take 5-10 mLs (5-10 mg total) by mouth every 4 (four) hours as needed for severe pain.  200 mL  0   No current facility-administered medications for this visit.    Review of Systems Review of Systems  Constitutional: Negative for fever, activity change, appetite change and unexpected weight change.   HENT: Negative for nosebleeds and trouble swallowing.   Eyes: Negative for photophobia and visual disturbance.  Respiratory: Negative for chest tightness and shortness of breath.   Cardiovascular: Negative for chest pain and leg swelling.       Denies CP, SOB, orthopnea, PND, DOE  Gastrointestinal: Negative for nausea, abdominal pain, diarrhea, constipation and blood in stool.       Occasional reflux  Genitourinary: Negative for dysuria and difficulty urinating.  Musculoskeletal: Negative for arthralgias.  Skin: Negative for pallor and rash.  Neurological: Negative for dizziness, seizures, facial asymmetry and numbness.          Hematological: Negative for adenopathy. Does not bruise/bleed easily.  Psychiatric/Behavioral: Negative for behavioral problems and agitation.    Blood pressure 129/88, pulse 80, temperature 98.5 F (36.9 C), temperature source Oral, resp. rate 17, height 5' 5"  (1.651 m), weight 317 lb 12.8 oz (144.153 kg).  Physical Exam Physical Exam  Vitals reviewed. Constitutional: She is oriented to person, place, and time. She appears well-developed and well-nourished. No distress.  Morbidly obese  HENT:  Head: Normocephalic and atraumatic.  Right Ear: External ear normal.  Left Ear: External ear normal.  Eyes: Conjunctivae are normal. No scleral icterus.  Neck: Normal range of motion. Neck supple. No tracheal deviation present. No thyromegaly present.  Cardiovascular: Normal rate and normal heart sounds.   Pulmonary/Chest: Effort normal and breath sounds normal. No stridor. No respiratory distress. She has no wheezes.  Abdominal: Soft. She exhibits no distension. There is no tenderness. There is no rebound and no guarding.  Musculoskeletal: She exhibits no edema and no tenderness.  Lymphadenopathy:    She has no cervical adenopathy.  Neurological: She is alert and oriented to person, place, and time. She exhibits normal muscle tone.  Skin: Skin is warm and dry.  No rash noted. She is not diaphoretic. No erythema.  Psychiatric: She has a normal mood and affect. Her behavior is normal. Judgment and thought content normal.    Data Reviewed My office note Upper GI-small reflux Chest x-ray-within normal limits Abdominal ultrasound-hepatic steatosis bariatric surgery evaluation labs-fasting blood glucose level 146, LDL level 120  Assessment    Morbid obesity BMI 52.88 Depression Asthma Impaired fasting glucose Elevated LDL     Plan    We reviewed her preoperative workup. We discussed the finding of her blood work. She was allowed to view the video on the intraoperative steps placement of a laparoscopic adjustable gastric band. We discussed the typical hospital course as well as the typical postoperative course.  I stressed the importance of the preoperative diet. I encouraged her to continue to walk on a daily basis. She was given access to watch the EMMI video on postop LapBand.   Note: This dictation was prepared with Dragon/digital dictation along with [employer]. Any transcriptional errors that result from this process are unintentional.  Leighton Ruff. Redmond Pulling, MD, FACS General, Bariatric, & Minimally Invasive Surgery [facility], [place]        [facility] M 11/29/2013, 5:52 AM

## 2013-12-03 NOTE — Transfer of Care (Signed)
Immediate Anesthesia Transfer of Care Note  Patient: Sherry Gordon  Procedure(s) Performed: Procedure(s): LAPAROSCOPIC ADJUSTABLE GASTRIC BAND PLACEMENT (N/A)  Patient Location: PACU  Anesthesia Type:General  Level of Consciousness: awake, alert  and oriented  Airway & Oxygen Therapy: Patient Spontanous Breathing and Patient connected to face mask oxygen  Post-op Assessment: Report given to PACU RN and Post -op Vital signs reviewed and stable  Post vital signs: Reviewed and stable  Complications: No apparent anesthesia complications

## 2013-12-03 NOTE — Progress Notes (Signed)
Patient is alert and oriented.  Pain is controlled, and patient is tolerating fluids.  Plan to advance to protein shake today.  Reviewed Adjustable gastric band discharge instructions with patient, patient able to articulate understanding.  Provided information on BELT program, Support Group and [facility] outpatient pharmacy. All questions answered, will continue to monitor.  

## 2013-12-03 NOTE — Anesthesia Postprocedure Evaluation (Signed)
  Anesthesia Post-op Note  Patient: Sherry Gordon  Procedure(s) Performed: Procedure(s) (LRB): LAPAROSCOPIC ADJUSTABLE GASTRIC BAND PLACEMENT (N/A)  Patient Location: PACU  Anesthesia Type: General  Level of Consciousness: awake and alert   Airway and Oxygen Therapy: Patient Spontanous Breathing  Post-op Pain: mild  Post-op Assessment: Post-op Vital signs reviewed, Patient's Cardiovascular Status Stable, Respiratory Function Stable, Patent Airway and No signs of Nausea or vomiting  Last Vitals:  Filed Vitals:   12/03/13 1030  BP: 114/83  Pulse: 76  Temp: 36.7 C  Resp: 12    Post-op Vital Signs: stable   Complications: No apparent anesthesia complications

## 2013-12-03 NOTE — Discharge Instructions (Signed)
° °                ° °ADJUSTABLE GASTRIC BAND ° Home Care Instructions ° ° These instructions are to help you care for yourself when you go home. ° °Call: If you have any problems. °• Call [phone] and ask for the surgeon on call °• If you need immediate assistance come to the ER at [place]. Tell the ER staff you are a new post-op gastric banding patient  °Signs and symptoms to report: • Severe  vomiting or nausea °o If you cannot handle clear liquids for longer than 1 day, call your surgeon °• Abdominal pain which does not get better after taking your pain medication °• Fever greater than 100.4°  F and chills °• Heart rate over 100 beats a minute °• Trouble breathing °• Chest pain °• Redness,  swelling, drainage, or foul odor at incision (surgical) sites °• If your incisions open or pull apart °• Swelling or pain in calf (lower leg) °• Diarrhea (Loose bowel movements that happen often), frequent watery, uncontrolled bowel movements °• Constipation, (no bowel movements for 3 days) if this happens: °o Take Milk of Magnesia, 2 tablespoons by mouth, 3 times a day for 2 days if needed °o Stop taking Milk of Magnesia once you have had a bowel movement °o Call your doctor if constipation continues °Or °o Take Miralax  (instead of Milk of Magnesia) following the label instructions °o Stop taking Miralax once you have had a bowel movement °o Call your doctor if constipation continues °• Anything you think is “abnormal for you” °  °Normal side effects after surgery: • Unable to sleep at night or unable to concentrate °• Irritability °• Being tearful (crying) or depressed ° °These are common complaints, possibly related to your anesthesia, stress of surgery, and change in lifestyle, that usually go away a few weeks after surgery. If these feelings continue, call your medical doctor.  °Wound Care: You may have surgical glue, steri-strips, or staples over your incisions after surgery °• Surgical glue: Looks like clear  film over your incisions and will wear off a little at a time °• Steri-strips: Adhesive strips of tape over your incisions. You may notice a yellowish color on skin under the steri-strips. This is used to make the steri-strips stick better. Do not pull the steri-strips off - let them fall off °• Staples: Staples may be removed before you leave the hospital °o If you go home with staples, call [facility] for an appointment with your surgeon’s nurse to have staples removed 10 days after surgery, [phone] °• Showering: You may shower two (2) days after your surgery unless your surgeon tells you differently °o Wash gently around incisions with warm soapy water, rinse well, and gently pat dry °o If you have a drain (tube from your incision), you may need someone to hold this while you shower °o No tub baths until staples are removed and incisions are healed °  °Medications: • Medications should be liquid or crushed if larger than the size of a dime °• Extended release pills (medication that releases a little bit at a time through the  day) should not be crushed °• Depending on the size and number of medications you take, you may need to space (take a few throughout the day)/change the time you take your medications so that you do not over-fill your pouch (smaller stomach) °• Make sure you follow-up with you primary care physician   to make medication changes needed during rapid weight loss and life -style changes °• If you have diabetes, follow up with your doctor that orders your diabetes medication(s) within one week after surgery and check your blood sugar regularly ° °• Do not drive while taking narcotics (pain medications) ° °• Do not take acetaminophen (Tylenol) and Roxicet or Lortab Elixir at the same time since these pain medications contain acetaminophen °  °Diet:  °First 2 Weeks You will see the nutritionist about two (2) weeks after your surgery. The nutritionist will increase the types of  foods you can eat if you are handling liquids well: °• If you have severe vomiting or nausea and cannot handle clear liquids lasting longer than 1 day call your surgeon °For Same Day Surgery Discharge Patients: °• The day of surgery drink water only: 2 ounces every 4 hours °• If you are handling water, start drinking your high protein shake the next morning °For Overnight Stay Patients: °• Begin by drinking 2 ounces of a high protein every 3 hours, 5-6 times per day °• Slowly increase the amount you drink as tolerated °• You may find it easier to slowly sip shakes throughout the day. It is important to get your proteins in first °  ° Protein Shake °• Drink at least 2 ounces of shake 5-6 times per day °• Each serving of protein shakes (usually 8-12 ounces) should have a minimum of: °o 15 grams of protein °o And no more than 5 grams of carbohydrate °• Goal for protein each day: °o Men = 80 grams per day °o Women = 60 grams per day °• Protein powder may be added to fluids such as non-fat milk or Lactaid milk or Soy milk (limit to 35 grams added protein powder per serving) ° °Hydration °• Slowly increase the amount of water and other clear liquids as tolerated (See Acceptable Fluids) °• Slowly increase the amount of protein shake as tolerated °• Sip fluids slowly and throughout the day °• May use sugar substitutes in small amounts (no more than 6-8 packets per day; i.e. Splenda) ° °Fluid Goal °• The first goal is to drink at least 8 ounces of protein shake/drink per day (or as directed by the nutritionist); some examples of protein shakes are Syntrax, Nectar, Adkins Advantage, EAS Edge HP, and Unjury. - See handout from pre-op Bariatric Education Class: °o Slowly increase the amount of protein shake you drink as tolerated °o You may find it easier to slowly sip shakes throughout the day °o It is important to get your proteins in first °• Your fluid goal is to drink 64-100 ounces of fluid daily °o It may take a few weeks  to build up to this  °• 32 oz. (or more) should be full liquids (see below for examples) °• Liquids should not contain sugar, caffeine, or carbonation ° °Clear Liquids: °• Water of Sugar-free flavored water (i.e. Fruit H²O, Propel) °• Decaffeinated coffee or tea (sugar-free) °• Lakiyah lite, Wyler’s Lite, Minute Maid Lite °• Sugar-free Jell-O °• Bouillon or broth °• Sugar-free Popsicle:    - Less than 20 calories each; Limit 1 per day ° ° ° ° °  ° Full Liquids: °                  Protein Shakes/Drinks + 2 choices per day of other full liquids °• Full liquids must be: °o No More Than 12 grams of Carbs per serving °o No More Than 3 grams   of Fat per serving °• Strained low-fat cream soup °• Non-Fat milk °• Fat-free Lactaid Milk °• Sugar-free yogurt (Dannon Lite & Fit, Greek yogurt) °  °Vitamins and Minerals • Start 1 day after surgery unless otherwise directed by your surgeon °• 1 Chewable Multivitamin / Multimineral Supplement with iron (i.e. Centrum for Adults) °• Chewable Calcium Citrate with Vitamin D-3 °(Example: 3 Chewable Calcium  Plus 600 with vitamin D-3) °o Take 500 mg three (3) times a day for a total of 1500 mg per day °o Do not take all 3 doses of calcium at one time as it may cause constipation, and you can only absorb 500 mg at a time °o Do not mix multivitamins containing iron with calcium supplements;  take 2 hours apart °o Do not substitute Tums (calcium carbonate) for your calcium °• Menstruating women and those at risk for anemia ( a blood disease that causes weakness) may need extra iron °o Talk to your doctor to see if you need more iron °• If you need extra iron: total daily iron recommendation (including Vitamins) is 50 to 100 mg Iron/day °• Do not stop taking or change any vitamins or minerals until you talk to your nutritionist or surgeon °• Your nutritionist and/or surgeon must approve all vitamin and mineral supplements  °Activity and Exercise: It is important to continue walking at home.  Limit your physical activity as instructed by your doctor. During this time, use these guidelines: °• Do not lift anything greater than ten  (10) pounds for at least two (2) weeks °• Do not go back to work or drive until your surgeon says you can °• You may have sex when you feel comfortable °o It is VERY important for female patients to use a reliable birth control method; fertility often increase after surgery °o Do not get pregnant for at least 18 months °• Start exercising as soon as your doctor tells you that you can °o Make sure your doctor approves any physical activity °• Start with a simple walking program °• Walk 5-15 minutes each day, 7 days per week °• Slowly increase until you are walking 30-45 minutes per day °• Consider joining our BELT program. [phone] or email [email or web address] ° °  °Special Instructions  Things to remember: °• Free counseling is available for you and your family through collaboration between [employer] and [employer]. Please call [phone] and leave a message °• Use your CPAP when sleeping if this applies to you °• Consider buying a medical alert bracelet that says you had lap-band surgery °• You will likely have your first fill (fluid added to your band) 6 - 8 weeks after surgery °• [facility] has a free Bariatric Surgery Support Group that meets monthly, the 3rd Thursday, 6pm. [facility] Classrooms. You can see classes online at [email or web address] °• It is very important to keep all follow up appointments with your surgeon, nutritionist, primary care physician, and behavioral health practitioner °o After the first year, please follow up with your bariatric surgeon and nutritionist at least once a year in order to maintain best weight loss results °      °             [facility]:  [phone] ° °             [facility]: [phone] ° °             Bariatric Nurse Coordinator:   [phone] °  °   Adjustable Gastric Band Home Care Instructions  Rev. 06/2012                                                            ° °     Reviewed and Endorsed °                                                  by [facility] Patient Education Committee, Jan, 2014 ° °

## 2013-12-03 NOTE — Op Note (Signed)
Sherry Gordon [id number] [birth year] 12/03/2013  Laparoscopic Adjustable Gastric Band Placement Operative Note   Pre-operative Diagnosis:  Morbid obesity BMI 51  Depression  Asthma  Impaired fasting glucose  Elevated LDL    Post-operative Diagnosis: same  Surgeon: [name]   Assistants: Dr Jaclynn Guarneri  Anesthesia: General endotracheal anesthesia  ASA Class: 3   Anesthesia: General plus local  Indications: Morbid Obesity unresponsive to medical treatment.  Findings: AP-S; no hiatal hernia   Procedure Details  The patient was seen in the Holding Room. The risks, benefits, complications, treatment options, and expected outcomes were discussed with the patient. The possibilities of reaction to medication, pulmonary aspiration, perforation of viscus, bleeding, recurrent infection, the need for additional procedures, failure to diagnose a condition, and creating a complication requiring transfusion or operation were discussed with the patient. The patient concurred with the proposed plan, giving informed consent.   The patient was taken to Operating Room # 1 at [facility], identified as Sherry Gordon and the procedure verified as Laparoscopic Adjustable Gastric Band Placement. A Time Out was held and the above information confirmed.  Full general anesthesia was induced with orotracheal intubation.  The patient was prepped and draped in a supine position. Appropriate antibiotics were given intravenously.  A 1cm incision was made 2 fingerbreadths below the left subcostal margin . Opitview technique was used to gain entry to the abdominal cavity. A 5 mm blunt trocar was advanced under direct vision through the abdominal wall with a 0 degree scope. The abdomen was insufflated, the laparoscope introduced.  There were no untoward findings on diagnostic laparoscopy. Trocars were placed under direct vision in the following fashion: a 5mm trocar in the lateral right upper quadrant, a  15mm trocar in the right upper quadrant, a 5mm trocar in the high epigastrium, and one 5mm trocar slightly above and to the left of the umbilicus. The patient was placed in reverse trendelenburg position.  The [facility] liver retractor was then placed through the high epigastric trocar site and positioned to hold the liver.    there was no evidence of a hiatal hernia on UGI, no dimpling noticed today, and patient really has no reflux. The angle of His was identified and the left crus was dissected free.  Approximately 8cm below the angle of His on the lesser curvature, passing through the pars flaccida and preserving the vagus nerve, a blunt instrument was gently passed anterior to the right crus and behind the gastro-esophageal junction without difficulty. Care was taken to minimize posterior dissection in order to prevent a posterior slip.   A AP-Standard Lap Band was introduced into the abdominal cavity through the 15mm trocar and carried around the gastro-esophageal junction and locked onto itself. Three interrupted 2.0 Ethibond sutures (each secured with a titanium tie knot) were used to imbricate the anterior stomach to itself over the band to prevent anterior slippage.   The bowel was examined and there were no obvious lesions. Hemostasis was verified. The liver retractor was removed under direct visualization. There was no evidence of liver injury. The tubing from the band was brought out via the right upper quadrant 15mm trocar site. All trocars were then removed under direct vision. The skin incision was lengthened and a subcutaneous space was made to accommodate the port. A 1 inch square of vicryl mesh was anchored to the base of the port with 4 sutures. The port was attached to the tubing and then placed in the subcutaneous pocket.  The redundant tubing  was advanced back into the abdominal cavity. Inverted interrupted deep dermal sutures using a 2-0 vicryl were placed.   The wounds were heavily  irrigated. The skin incisions were closed with 4-0 monocryl. Dermabond was applied.   Instrument, sponge, and needle counts were correct prior to wound closure and at the conclusion of the case.          Complications:  None; patient tolerated the procedure well.                Condition: stable  Sherry SellaEric M. Andrey CampanileWilson, MD, FACS General, Bariatric, & Minimally Invasive Surgery [facility] [place]

## 2013-12-03 NOTE — Progress Notes (Signed)
Nutrition Education Note  Received consult for diet education per DROP protocol.   Discussed 2 week post op diet with pt. Emphasized that liquids must be non carbonated, non caffeinated, and sugar free. Fluid goals discussed. Pt to follow up with outpatient bariatric RD for further diet progression after 2 weeks. Multivitamins and minerals also reviewed. Teach back method used, pt expressed understanding, expect good compliance.  Pt very aware of 2 week post-op diet and has majority of diet items purchased at home already. Without any questions or concerns.    Diet: First 2 Weeks  You will see the nutritionist about two (2) weeks after your surgery. The nutritionist will increase the types of foods you can eat if you are handling liquids well:  If you have severe vomiting or nausea and cannot handle clear liquids lasting longer than 1 day, call your surgeon  Protein Shake  Drink at least 2 ounces of shake 5-6 times per day  Each serving of protein shakes (usually 8 - 12 ounces) should have a minimum of:  15 grams of protein  And no more than 5 grams of carbohydrate  Goal for protein each day:  Men = 80 grams per day  Women = 60 grams per day  Protein powder may be added to fluids such as non-fat milk or Lactaid milk or Soy milk (limit to 35 grams added protein powder per serving)   Hydration  Slowly increase the amount of water and other clear liquids as tolerated (See Acceptable Fluids)  Slowly increase the amount of protein shake as tolerated  Sip fluids slowly and throughout the day  May use sugar substitutes in small amounts (no more than 6 - 8 packets per day; i.e. Splenda)   Fluid Goal  The first goal is to drink at least 8 ounces of protein shake/drink per day (or as directed by the nutritionist); some examples of protein shakes are [employer]x Nectar, Dillard'sdkins Advantage, EAS Edge HP, and Unjury. See handout from pre-op Bariatric Education Class:  Slowly increase the amount of protein  shake you drink as tolerated  You may find it easier to slowly sip shakes throughout the day  It is important to get your proteins in first  Your fluid goal is to drink 64 - 100 ounces of fluid daily  It may take a few weeks to build up to this  32 oz (or more) should be clear liquids  And  32 oz (or more) should be full liquids (see below for examples)  Liquids should not contain sugar, caffeine, or carbonation   Clear Liquids:  Water or Sugar-free flavored water (i.e. Fruit H2O, Propel)  Decaffeinated coffee or tea (sugar-free)  Wilena Lite, Wyler's Lite, Minute Maid Lite  Sugar-free Jell-O  Bouillon or broth  Sugar-free Popsicle: *Less than 20 calories each; Limit 1 per day   Full Liquids:  Protein Shakes/Drinks + 2 choices per day of other full liquids  Full liquids must be:  No More Than 12 grams of Carbs per serving  No More Than 3 grams of Fat per serving  Strained low-fat cream soup  Non-Fat milk  Fat-free Lactaid Milk  Sugar-free yogurt (Dannon Lite & Fit, Greek yogurt)    CoyanosaHeather Christobal Morado MS, RD, [place] [phone] Pager [phone] Weekend/After Hours Pager

## 2013-12-03 NOTE — Interval H&P Note (Signed)
History and Physical Interval Note:  12/03/2013 6:57 AM  Sherry Gordon  has presented today for surgery, with the diagnosis of Morbid Obesity  The various methods of treatment have been discussed with the patient and family. After consideration of risks, benefits and other options for treatment, the patient has consented to  Procedure(s): LAPAROSCOPIC GASTRIC BANDING (N/A) as a surgical intervention .  The patient's history has been reviewed, patient examined, no change in status, stable for surgery.  I have reviewed the patient's chart and labs.  Questions were answered to the patient's satisfaction.    Mary SellaEric M. Andrey CampanileWilson, MD, FACS General, Bariatric, & Minimally Invasive Surgery [facility], [place]   [facility]claren Greater LansingWILSON,Gibran Veselka M

## 2013-12-04 ENCOUNTER — Telehealth (HOSPITAL_COMMUNITY): Payer: Self-pay

## 2013-12-04 ENCOUNTER — Other Ambulatory Visit (INDEPENDENT_AMBULATORY_CARE_PROVIDER_SITE_OTHER): Payer: Self-pay

## 2013-12-04 ENCOUNTER — Telehealth (INDEPENDENT_AMBULATORY_CARE_PROVIDER_SITE_OTHER): Payer: Self-pay

## 2013-12-04 ENCOUNTER — Encounter (HOSPITAL_COMMUNITY): Payer: Self-pay | Admitting: General Surgery

## 2013-12-04 DIAGNOSIS — R109 Unspecified abdominal pain: Secondary | ICD-10-CM

## 2013-12-04 MED ORDER — HYDROCODONE-ACETAMINOPHEN 5-325 MG PO TABS: 1.0000 | ORAL_TABLET | Freq: Four times a day (QID) | ORAL | Status: DC | PRN

## 2013-12-04 NOTE — Telephone Encounter (Signed)
Pt s/p lap gastric band on 12/03/13 by Dr Andrey CampanileWilson. Pt states that every time she has taken Oxycodone 5mg  solution she has had n/v. Pt would like to see if Dr Andrey CampanileWilson can call give her something else to take. Pt rates her pain a 4 at this time. Pt states that she has tried to take Tylenol with no relief. Informed pt that I would send Dr Andrey CampanileWilson a message and we would contact her as soon as we received a response. Pt verbalized understanding.

## 2013-12-04 NOTE — Telephone Encounter (Signed)
Called pt to see what pain meds that she has had before. Pt states that she can not remember but she thinks that she has taking Hydrocodone(Vicodin) before. Informed pt that this could possibly be in a pill form and she was fine with this. Informed pt that I would inform Dr Andrey CampanileWilson.

## 2013-12-04 NOTE — Telephone Encounter (Signed)
vicodin 5/325 1-2 tabs po q 4hrs prn pain, disp 40. If vicodin an issue, can use norco same dosage, tabs, frequency. Please ask an MD to sign script.   Mary SellaEric M. Andrey CampanileWilson, MD, FACS General, Bariatric, & Minimally Invasive Surgery [facility], [place]

## 2013-12-04 NOTE — Telephone Encounter (Signed)
Attempted to make DROP discharge phone call, but number is disconnected.  No other number listed in [facility], or Bariatric Lead tracker.  Skip EstimableLaurie Deaton, RN

## 2013-12-05 ENCOUNTER — Other Ambulatory Visit (INDEPENDENT_AMBULATORY_CARE_PROVIDER_SITE_OTHER): Payer: Self-pay

## 2013-12-05 ENCOUNTER — Telehealth (INDEPENDENT_AMBULATORY_CARE_PROVIDER_SITE_OTHER): Payer: Self-pay

## 2013-12-05 MED ORDER — ONDANSETRON HCL 4 MG PO TABS: 4.0000 mg | ORAL_TABLET | Freq: Four times a day (QID) | ORAL | Status: DC | PRN

## 2013-12-05 NOTE — Telephone Encounter (Signed)
Pt s/p lap gastric band on 12/03/13 by Dr Andrey CampanileWilson. Pt states that she has still been having nausea and dry heaves. Pt states that she has been able to do liquids and the shakes and keep those down. Informed pt that we would call in a rx for Zofran 4mg  1 tablet evry 6 hours as needed for nausea #15 per protocol. Advised pt that if the n/v continues or becomes worse to give the office a call back. Pt verbalized understanding

## 2013-12-17 ENCOUNTER — Encounter: Payer: BC Managed Care – PPO | Attending: General Surgery

## 2013-12-17 VITALS — Ht 65.0 in | Wt 304.0 lb

## 2013-12-17 DIAGNOSIS — Z713 Dietary counseling and surveillance: Secondary | ICD-10-CM | POA: Insufficient documentation

## 2013-12-17 DIAGNOSIS — Z01818 Encounter for other preprocedural examination: Secondary | ICD-10-CM | POA: Diagnosis not present

## 2013-12-17 DIAGNOSIS — Z6841 Body Mass Index (BMI) 40.0 and over, adult: Secondary | ICD-10-CM

## 2013-12-17 NOTE — Progress Notes (Signed)
Bariatric Class:  Appt start time: 1530 end time:  1630.  2 Week Post-Operative Nutrition Class  Patient was seen on 12/17/2013 for Post-Operative Nutrition [facility]  Surgery date:  Surgery type: LAGB Start weight at [facility]: 317.5 lbs on 09/07/13 Weight today: 304.0 lbs Weight change: 17 lbs  TANITA  BODY COMP RESULTS  11/18/13 12/17/13   BMI (kg/m^2) 53.4 50.6   Fat Mass (lbs) 179.5 158.0   Fat Free Mass (lbs) 141.5 146.0   Total Body Water (lbs) 103.5 107.0    The following the learning objectives were met by the patient during this course:  Identifies Phase 3A (Soft, High Proteins) Dietary Goals and will begin from 2 weeks post-operatively to 2 months post-operatively  Identifies appropriate sources of fluids and proteins   States protein recommendations and appropriate sources post-operatively  Identifies the need for appropriate texture modifications, mastication, and bite sizes when consuming solids  Identifies appropriate multivitamin and calcium sources post-operatively  Describes the need for physical activity post-operatively and will follow MD recommendations  States when to call healthcare provider regarding medication questions or post-operative complications  Handouts given during class include:  Phase 3A: Soft, High Protein Diet Handout  Follow-Up Plan: Patient will follow-up at [facility] in 4 weeks for 6 week post-op nutrition visit for diet advancement per MD.

## 2013-12-17 NOTE — Patient Instructions (Signed)
Patient to follow Phase 3A-Soft, High Protein Diet and [facility] for 6 week post-op nutrition visit for diet advancement.

## 2013-12-27 ENCOUNTER — Ambulatory Visit (INDEPENDENT_AMBULATORY_CARE_PROVIDER_SITE_OTHER): Payer: BC Managed Care – PPO | Admitting: General Surgery

## 2013-12-27 ENCOUNTER — Encounter (INDEPENDENT_AMBULATORY_CARE_PROVIDER_SITE_OTHER): Payer: Self-pay | Admitting: General Surgery

## 2013-12-27 VITALS — BP 126/78 | HR 73 | Temp 97.5°F | Ht 65.0 in | Wt 300.0 lb

## 2013-12-27 DIAGNOSIS — Z09 Encounter for follow-up examination after completed treatment for conditions other than malignant neoplasm: Secondary | ICD-10-CM

## 2013-12-27 DIAGNOSIS — Z9884 Bariatric surgery status: Secondary | ICD-10-CM

## 2013-12-27 NOTE — Patient Instructions (Signed)
1. Stay on liquids for the next 1- 2 days as you adapt to your new fill volume.  Then resume your previous diet. 2. Decreasing your carbohydrate intake will hasten you weight loss.  Rely more on proteins for your meals.  Avoid condiments that contain sweets such as Honey Mustard and sugary salad dressings.   3. Stay in the "green zone".  If you are regurgitating with meals, having night time reflux, and find yourself eating soft comfort foods (mashed potatoes, potato chips)...realize that you are developing "maladaptive eating".  You will not lose weight this way and may regain weight.  The GREEN ZONE is eating smaller portions and not regurgitating.  Hence we may need to withdraw fluid from your band. 4. Build exercise into your daily routine.  Walking is the best way to start but do something every day if you can.    Eating techniques 20-20-20 (30-30-30) 20 chews, 20 seconds between bites of food, 20 minutes to eat; sometimes you may need 30 chews, 30 seconds etc Use your nondominant hand to eat with Put fork down between bites of food Use a timer after swallowing to reinforce waiting 20-30 sec between bites of food Use a child/infant size utensil Try not to eat while watching TV  Watch EMMI video

## 2013-12-29 NOTE — Progress Notes (Signed)
Subjective:     Patient ID: Sherry Gordon, female   DOB: [birth year], 38 y.o.   MRN: [id number]  HPI 38 yo WF s/p LAGB 8/4 comes in for her first postop appt. She states she is doing well. She denies any nausea, vomiting, abd pain, reflux, regurgitation, constipation. She denies any difficulty eating or drinking. She reports ongoing hunger. She is walking everyday using different intensities for about 20 minutes. She reports no restriction.   Review of Systems     Objective:   Physical Exam BP 126/78  Pulse 73  Temp(Src) 97.5 F (36.4 C)  Ht  (1.651 m)  Wt 300 lb (136.079 kg)  BMI 49.92 kg/m2  See LapBand flowsheet  Gen: alert, NAD, non-toxic appearing HEENT: normocephalic, atraumatic; pupils equal, no scleral icterus, neck supple, Pulm: Lungs clear to auscultation, symmetric chest rise CV: regular rate and rhythm Abd: soft, nontender, nondistended. Well-healed trocar sites. No incisional hernia. Port is in right mid-abdomen Ext: no edema, normal,  Neuro: nonfocal, sensation grossly intact Psych: appropriate, judgment normal     Assessment:     S/p LAGB Depression Asthma Impaired fasting glucose     Plan:     Overall she appears to be doing well. No sign of complication to date. Preop weight 317 pounds and initial visit weight 322. i congratulated her on her weight loss.  Since she has no restriction, I recommended an adjustment  After obtaining verbal consent, the abdominal wall was prepped with Chloraprep. The port was accessed with a Huber needle and 2 cc of saline was added to give the patient an expected fill volume of 6 cc.  The patient was able to tolerate sips of water.   She was instructed to stay on liquids for next 24 days. We discussed proper eating techniques and food choices. She was given Agricultural engineer. She was given access to watch EMMI video on 6 week post op LapBand. F/u 6 weeks  Mary Sella. Andrey Campanile, MD, FACS General, Bariatric, & Minimally  Invasive Surgery [facility], [place]

## 2014-01-20 ENCOUNTER — Encounter: Payer: BC Managed Care – PPO | Attending: General Surgery | Admitting: Dietician

## 2014-01-20 VITALS — Ht 65.0 in | Wt 288.0 lb

## 2014-01-20 DIAGNOSIS — Z01818 Encounter for other preprocedural examination: Secondary | ICD-10-CM | POA: Diagnosis not present

## 2014-01-20 DIAGNOSIS — Z713 Dietary counseling and surveillance: Secondary | ICD-10-CM | POA: Diagnosis present

## 2014-01-20 DIAGNOSIS — Z6841 Body Mass Index (BMI) 40.0 and over, adult: Secondary | ICD-10-CM

## 2014-01-20 NOTE — Progress Notes (Signed)
Follow-up visit:  6 Weeks Post-Operative LAGB Surgery  Medical Nutrition Therapy:  Appt start time: 435 end time:  515.  Primary concerns today: Post-operative Bariatric Surgery Nutrition Management.  Sherry Gordon returns today having lost 16 lbs since her last visit. She has been tracking her food on My Fitness Pal and reports that it's working well for her and keeping her accountable. She feels that her band is not yet in "green zone." [name] states that she feels restricted, but unsure if it's mental or physical. Sleeping better and has more energy. Hopes to do a 5k in October.   Patient was seen on 12/17/2013 for Post-Operative Nutrition education at [facility].  Surgery date: 12/03/13 Surgery type: LAGB Start weight at [facility]: 317.5 lbs on 09/07/13 (326 lbs per patient report) Weight today: 288 lbs Weight change: 16 lbs Total weight lost: 38 lbs  TANITA  BODY COMP RESULTS  11/18/13 12/17/13 01/20/14   BMI (kg/m^2) 53.4 50.6 47.9   Fat Mass (lbs) 179.5 158.0 146   Fat Free Mass (lbs) 141.5 146.0 142   Total Body Water (lbs) 103.5 107.0 104    Preferred Learning Style:   No preference indicated   Learning Readiness:   Ready  24-hr recall: B (AM): Pure Protein shake or premier shake (30g) Snk (AM):   L (PM): [place] yogurt, string cheese, deli meat (18-24g) Snk (PM): yogurt or string cheese (6-12g)  D (PM): Meatballs and spaghetti sauce OR chicken and cheese (1.5-2 oz meat) Snk (PM): protein shake (30g)  Fluid intake: 75 oz of water (sometimes with flavor) Estimated total protein intake: 60+ grams per day  Medications: see list Supplementation: taking  Using straws: occasionally, no gas pain Drinking while eating: no Hair loss: very little Carbonated beverages: no N/V/D/C: none Dumping syndrome: no Last Lap-Band fill: 2 cc's on 12/27/2013  Recent physical activity:  Walking in the evenings every day (10,000 steps a day)  Progress Towards  Goal(s):  In progress.  Handouts given during visit include:  Phase 3B lean protein + non-starchy vegetables   Nutritional Diagnosis:  [place]-3.3 Overweight/obesity related to past poor dietary habits and physical inactivity as evidenced by patient w/ recent LAGB surgery following dietary guidelines for continued weight loss.     Intervention:  Nutrition counseling provided.  Teaching Method Utilized: Visual Auditory  Barriers to learning/adherence to lifestyle change: none  Demonstrated degree of understanding via:  Teach Back   Monitoring/Evaluation:  Dietary intake, exercise, lap band fills, and body weight. Follow up in 2 months for 4.5 month post-op visit.

## 2014-01-20 NOTE — Patient Instructions (Addendum)
Goals:  Follow Phase 3B: High Protein + Non-Starchy Vegetables  Eat 3-6 small meals/snacks, every 3-5 hrs  Increase lean protein foods to meet 60g goal  Increase fluid intake to 64oz +  Avoid drinking 15 minutes before, during and 30 minutes after eating  Aim for >30 min of physical activity daily  Try Citracal Petites  Limit carbs to 15g per meal    TANITA  BODY COMP RESULTS  11/18/13 12/17/13 01/20/14   BMI (kg/m^2) 53.4 50.6 47.9   Fat Mass (lbs) 179.5 158.0 146   Fat Free Mass (lbs) 141.5 146.0 142   Total Body Water (lbs) 103.5 107.0 104

## 2014-02-13 ENCOUNTER — Ambulatory Visit (INDEPENDENT_AMBULATORY_CARE_PROVIDER_SITE_OTHER): Payer: BC Managed Care – PPO | Admitting: General Surgery

## 2014-03-24 ENCOUNTER — Encounter: Payer: BC Managed Care – PPO | Attending: General Surgery | Admitting: Dietician

## 2014-03-24 VITALS — Ht 65.0 in | Wt 276.0 lb

## 2014-03-24 DIAGNOSIS — Z9884 Bariatric surgery status: Secondary | ICD-10-CM | POA: Insufficient documentation

## 2014-03-24 DIAGNOSIS — E668 Other obesity: Secondary | ICD-10-CM | POA: Insufficient documentation

## 2014-03-24 DIAGNOSIS — Z713 Dietary counseling and surveillance: Secondary | ICD-10-CM | POA: Diagnosis not present

## 2014-03-24 DIAGNOSIS — Z6841 Body Mass Index (BMI) 40.0 and over, adult: Secondary | ICD-10-CM

## 2014-03-24 NOTE — Progress Notes (Signed)
Follow-up visit:  6 Weeks Post-Operative LAGB Surgery  Medical Nutrition Therapy:  Appt start time: 425 end time:  455  Primary concerns today: Post-operative Bariatric Surgery Nutrition Management.   Sherry Gordon returns today with a 12-lbs weight loss. 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 She plans to follow up with [name] regarding stress eating. Has not had a band fill lately. Not sure whether she feels enough restriction, has an appointment at the beginning of December. Still tracking food on My Fitness Pal.   Surgery date: 12/03/13 Surgery type: LAGB Start weight at Sherry Gordon: 317.5 lbs on 09/07/13 (326 lbs per patient report) Weight today: 276 lbs Weight change: 12 lbs Total weight lost: 50 lbs  TANITA  BODY COMP RESULTS  11/18/13 12/17/13 01/20/14 03/24/14   BMI (kg/m^2) 53.4 50.6 47.9 45.9   Fat Mass (lbs) 179.5 158.0 146 139.5   Fat Free Mass (lbs) 141.5 146.0 142 136.5   Total Body Water (lbs) 103.5 107.0 104 100    Preferred Learning Style:   No preference indicated   Learning Readiness:   Ready  24-hr recall: B (AM): coffee and yogurt or egg, ham, and cheese (12g) Snk (AM):   L (PM): P3 pack (12g) Snk (PM): ham and cheese (12g) D (PM): crustless pizza casserole (3-4 oz) Snk (PM): protein shake (30g)  Fluid intake: at least 64 oz of water (sometimes with flavor) Estimated total protein intake: 60+ grams per day  Medications: see list Supplementation: taking; Calcium inconsistent but drinks milk regularly and has a lot of cheese and yogurt  Using straws: occasionally, no gas pain Drinking while eating: no Hair loss: very Sherry Carbonated beverages: no N/V/D/C: none Dumping syndrome: no Last Lap-Band fill: 2 cc's on 12/27/2013  Recent physical activity:  Walking occasionally  Progress Towards Goal(s):  In progress.  Handouts given during visit include:  Bariatric Holiday Eating   Nutritional  Diagnosis:  Sherry Gordon Overweight/obesity related to past poor dietary habits and physical inactivity as evidenced by patient w/ recent LAGB surgery following dietary guidelines for continued weight loss.     Intervention:  Nutrition counseling provided.  Teaching Method Utilized: Visual Auditory  Barriers to learning/adherence to lifestyle change: none  Demonstrated degree of understanding via:  Teach Back   Monitoring/Evaluation:  Dietary intake, exercise, lap band fills, and body weight. Follow up in 2 months for 5.5 month post-op visit.

## 2014-03-24 NOTE — Patient Instructions (Addendum)
Goals:  Set a realistic exercise goal  Try Citracal Petites  Limit carbs to 15g per meal  Consider following up with [name] about emotional eating   TANITA  BODY COMP RESULTS  11/18/13 12/17/13 01/20/14 03/24/14   BMI (kg/m^2) 53.4 50.6 47.9 45.9   Fat Mass (lbs) 179.5 158.0 146 139.5   Fat Free Mass (lbs) 141.5 146.0 142 136.5   Total Body Water (lbs) 103.5 107.0 104 100

## 2014-05-27 ENCOUNTER — Ambulatory Visit: Payer: BC Managed Care – PPO | Admitting: Dietician

## 2014-06-30 ENCOUNTER — Other Ambulatory Visit (INDEPENDENT_AMBULATORY_CARE_PROVIDER_SITE_OTHER): Payer: Self-pay | Admitting: General Surgery

## 2014-06-30 DIAGNOSIS — R1011 Right upper quadrant pain: Secondary | ICD-10-CM

## 2014-06-30 NOTE — Addendum Note (Signed)
Addended by: Gaynelle AduWILSON, Marlissa Emerick M on: 06/30/2014 09:42 PM   Modules accepted: Orders

## 2014-07-01 ENCOUNTER — Encounter: Payer: BC Managed Care – PPO | Attending: General Surgery | Admitting: Dietician

## 2014-07-01 ENCOUNTER — Other Ambulatory Visit (INDEPENDENT_AMBULATORY_CARE_PROVIDER_SITE_OTHER): Payer: Self-pay

## 2014-07-01 VITALS — Ht 65.0 in | Wt 270.0 lb

## 2014-07-01 DIAGNOSIS — Z713 Dietary counseling and surveillance: Secondary | ICD-10-CM | POA: Insufficient documentation

## 2014-07-01 DIAGNOSIS — R1011 Right upper quadrant pain: Secondary | ICD-10-CM

## 2014-07-01 DIAGNOSIS — Z6841 Body Mass Index (BMI) 40.0 and over, adult: Secondary | ICD-10-CM | POA: Diagnosis not present

## 2014-07-01 NOTE — Patient Instructions (Addendum)
    TANITA  BODY COMP RESULTS  11/18/13 12/17/13 01/20/14 03/24/14 07/01/14   BMI (kg/m^2) 53.4 50.6 47.9 45.9 44.9   Fat Mass (lbs) 179.5 158.0 146 139.5 128.5   Fat Free Mass (lbs) 141.5 146.0 142 136.5 141.5   Total Body Water (lbs) 103.5 107.0 104 100 103.5

## 2014-07-01 NOTE — Progress Notes (Signed)
Follow-up visit:  7 months Post-Operative LAGB Surgery  Medical Nutrition Therapy:  Appt start time: 340 end time:  410  Primary concerns today: Post-operative Bariatric Surgery Nutrition Management.   Sherry Gordon returns today having lost 11 pounds of fat since November. She is still tracking food on My Fitness Pal. She states she needs to get a fill. Has not yet followed up with [name]. Having RUQ pain that is exacerbated with beef; plans to schedule an appointment with surgeon.  Surgery date: 12/03/13 Surgery type: LAGB [facility]5 lbs on 09/07/13 (326 lbs per patient report) Weight today: 270 lbs Weight change: 6 lbs (11 lbs of fat) Total weight lost: 56 lbs  TANITA  BODY COMP RESULTS  11/18/13 12/17/13 01/20/14 03/24/14 07/01/14   BMI (kg/m^2) 53.4 50.6 47.9 45.9 44.9   Fat Mass (lbs) 179.5 158.0 146 139.5 128.5   Fat Free Mass (lbs) 141.5 146.0 142 136.5 141.5   Total Body Water (lbs) 103.5 107.0 104 100 103.5    Preferred Learning Style:   No preference indicated   Learning Readiness:   Ready  24-hr recall: B (AM): coffee with protein shake (15-20g) Snk (AM):  Yogurt (12g) L (PM): tuna packet (14g) Snk (PM): cheese stick (7g) D (PM): chicken or tacos without the shell (21g) Snk (PM):   Fluid intake: at least 64 oz of water (sometimes with flavor) Estimated total protein intake: 70+ grams per day  Medications: see list Supplementation: taking; Calcium inconsistent but drinks milk regularly and has a lot of cheese and yogurt  Using straws: occasionally, no gas pain Drinking while eating: no Hair loss: very little Carbonated beverages: 1-2 sips of diet soda  N/V/D/C: vomiting; having RUQ pain Dumping syndrome: no Last Lap-Band fill: 2 cc's on 12/27/2013  Recent physical activity:  Walking for 20 min 3x a week  Progress Towards Goal(s):  In progress.  Handouts given during visit include:  Snack ideas   Nutritional Diagnosis:  [place]-3.3  Overweight/obesity related to past poor dietary habits and physical inactivity as evidenced by patient w/ recent LAGB surgery following dietary guidelines for continued weight loss.     Intervention:  Nutrition counseling provided.  Teaching Method Utilized: Visual Auditory  Barriers to learning/adherence to lifestyle change: none  Demonstrated degree of understanding via:  Teach Back   Monitoring/Evaluation:  Dietary intake, exercise, lap band fills, and body weight. Follow up in 6 weeks for 9 month post-op visit.

## 2014-07-02 ENCOUNTER — Other Ambulatory Visit (INDEPENDENT_AMBULATORY_CARE_PROVIDER_SITE_OTHER): Payer: Self-pay | Admitting: General Surgery

## 2014-07-02 ENCOUNTER — Other Ambulatory Visit (INDEPENDENT_AMBULATORY_CARE_PROVIDER_SITE_OTHER): Payer: Self-pay

## 2014-07-02 DIAGNOSIS — R1030 Lower abdominal pain, unspecified: Secondary | ICD-10-CM

## 2014-07-02 NOTE — Addendum Note (Signed)
Addended by: Gaynelle AduWILSON, Boy Delamater M on: 07/02/2014 04:33 PM   Modules accepted: Orders

## 2014-07-04 ENCOUNTER — Other Ambulatory Visit (INDEPENDENT_AMBULATORY_CARE_PROVIDER_SITE_OTHER): Payer: Self-pay | Admitting: *Deleted

## 2014-07-04 DIAGNOSIS — R1030 Lower abdominal pain, unspecified: Secondary | ICD-10-CM

## 2014-07-04 DIAGNOSIS — R109 Unspecified abdominal pain: Secondary | ICD-10-CM

## 2014-07-07 ENCOUNTER — Ambulatory Visit
Admission: RE | Admit: 2014-07-07 | Discharge: 2014-07-07 | Disposition: A | Discharge status: Home / Self Care | Payer: BC Managed Care – PPO | Source: Ambulatory Visit | Attending: General Surgery | Admitting: General Surgery

## 2014-07-12 ENCOUNTER — Ambulatory Visit (INDEPENDENT_AMBULATORY_CARE_PROVIDER_SITE_OTHER): Payer: Self-pay | Admitting: General Surgery

## 2014-07-12 NOTE — H&P (Signed)
Sherry Gordon 07/09/2014 9:15 AM Location: [facility] Patient #: [id number] DOB: Sep 19, 1975 Married / Language: English / Race: Goh[place] Female  History of Present Illness Sherry Gordon(Alley Neils M. Sparrow Sanzo MD; 07/12/2014 12:36 PM) Patient words: lap band f/u.  The patient is a 39 year old female is here for lapband followup. 39 year old morbidly obese Caucasian female comes in for followup after undergoing laparoscopic adjustable gastric band placement on 12/03/2013. Her preoperative weight was 317 pounds. Her initial visit weight was 322 pounds.  I last saw her in the office on October 15. At that time her weight was 280 pounds pounds. She called in last week complaining of right sided discomfort especially after meals. These episodes were self-limited; however, she had one severe episode where the pain lasted for several hours and was quite intense almost causing her to go to the emergency room. She developed nausea and vomiting and the pain resolved so she did not go to the emergency room. Most of these episodes last less than one hour. She denies any jaundice or acholic stools. She denies any NSAID use. She underwent abdominal ultrasound on March 7 which demonstrated multiple gallstones with normal wall thickness. Common bile duct diameter was 3.3 mm. Liver was within normal limits. She also underwent blood work which revealed an ALT level of 159 BUNs slightly elevated at 24 and normal CBC.  She denies any fever, chills, regurgitation, nighttime cough, or reflux. She is averaging 9000 steps a day. She is mainly walking. She reports that she is not hungry between meals.   Other Problems [name](Farhana Fellows M Hammond Obeirne, MD; 07/12/2014 12:34 PM) LAP-BAND SURGERY STATUS (V45.86  Z98.84) SYMPTOMATIC CHOLELITHIASIS (574.20  K80.20) POSTPRANDIAL RUQ PAIN (789.01  R10.11) IMPAIRED GLUCOSE TOLERANCE IN OBESE (790.29  R73.02) MORBID OBESITY WITH BMI OF 45.0-49.9, ADULT (278.01   E66.01) Depression Asthma  Past Surgical History [name](Coni Homesley M Tilly Pernice, MD; 07/12/2014 12:34 PM) Tonsillectomy  Diagnostic Studies History [name](Raelynne Ludwick M Kynzi Levay, MD; 07/12/2014 12:34 PM) Pap Smear 1-5 years ago Colonoscopy never Mammogram never  Allergies (Ammie Eversole, LPN; 9/8/11913/01/2015 4:789:42 AM) Adhesive Tape  Medication History (Ammie Eversole, LPN; 2/9/56213/01/2015 3:089:43 AM) ZyrTEC Childrens Allergy (10MG  Tablet Chewable, Oral) Active. Effexor (75MG  Tablet, Oral) Active.  Social History [name](Jameah Rouser M Efrata Brunner, MD; 07/12/2014 12:34 PM) No drug use Tobacco use Former smoker. Alcohol use Occasional alcohol use. Caffeine use Coffee.  Family History [name](Sirena Riddle M Yassmine Tamm, MD; 07/12/2014 12:34 PM) Depression Family Members In General, Father, Sister. Diabetes Mellitus Family Members In General. Heart disease in female family member before age 37 Respiratory Condition Father.  Pregnancy / Birth History [name](Tajh Livsey M Khairi Garman, MD; 07/12/2014 12:34 PM) Contraceptive History Intrauterine device. Gravida 1 Irregular periods Age at menarche 14 years. Maternal age 37-30 Para 1  Vitals (Ammie Eversole LPN; 6/5/78463/01/2015 9:629:42 AM) 07/09/2014 9:41 AM Weight: 269 lb Height: 65.5in Body Surface Area: 2.37 m Body Mass Index: 44.08 kg/m Pulse: 86 (Regular)  BP: 132/84 (Sitting, Left Arm, Standard)    Physical Exam Sherry Gordon(Jereme Loren M. Latanga Nedrow MD; 07/12/2014 12:31 PM) General Mental Status-Alert. General Appearance-Consistent with stated age. Hydration-Well hydrated. Voice-Normal. Note: Morbidly obese   Head and Neck Head-normocephalic, atraumatic with no lesions or palpable masses. Trachea-midline. Thyroid Gland Characteristics - normal size and consistency.  Eye Eyeball - Bilateral-Extraocular movements intact. Sclera/Conjunctiva - Bilateral-No scleral icterus.  Chest and Lung Exam Chest and lung exam reveals -quiet, even and easy respiratory effort with no use of accessory muscles and  on auscultation, normal breath sounds, no adventitious sounds and normal vocal resonance.  Inspection Chest Wall - Normal. Back - normal.  Breast - Did not examine.  Cardiovascular Cardiovascular examination reveals -normal heart sounds, regular rate and rhythm with no murmurs and normal pedal pulses bilaterally.  Abdomen Inspection Inspection of the abdomen reveals - No Hernias. Skin - Scar - Note: Well-healed trocar site. Palpation/Percussion Palpation and Percussion of the abdomen reveal - Soft, Non Tender, No Rebound tenderness, No Rigidity (guarding) and No hepatosplenomegaly. Note: Palpable port in the right mid abdomen. Auscultation Auscultation of the abdomen reveals - Bowel sounds normal.  Peripheral Vascular Upper Extremity Palpation - Pulses bilaterally normal.  Neurologic Neurologic evaluation reveals -alert and oriented x 3 with no impairment of recent or remote memory. Mental Status-Normal.  Neuropsychiatric The patient's mood and affect are described as -normal. Judgment and Insight-insight is appropriate concerning matters relevant to self.  Musculoskeletal Normal Exam - Left-Upper Extremity Strength Normal and Lower Extremity Strength Normal. Normal Exam - Right-Upper Extremity Strength Normal and Lower Extremity Strength Normal.  Lymphatic Head & Neck  General Head & Neck Lymphatics: Bilateral - Description - Normal. Axillary - Did not examine. Femoral & Inguinal - Did not examine.    Assessment & Plan Sherry Gordon M. Alyas Creary MD; 07/12/2014 12:36 PM) SYMPTOMATIC CHOLELITHIASIS (574.20  K80.20) Impression: Her preoperative abdominal ultrasound prior to lap band surgery showed no gallstones. She appears to have developed gallstones probably as result of her weight loss. Current Plans  I believe the patient's symptoms are consistent with gallbladder disease.  We discussed gallbladder disease. The patient was given Agricultural engineer. We discussed  non-operative and operative management. We discussed the signs & symptoms of acute cholecystitis  I discussed laparoscopic cholecystectomy with IOC in detail. The patient was given educational material as well as diagrams detailing the procedure. We discussed the risks and benefits of a laparoscopic cholecystectomy including, but not limited to bleeding, infection, injury to surrounding structures such as the intestine or liver, bile leak, retained gallstones, need to convert to an open procedure, prolonged diarrhea, blood clots such as DVT, common bile duct injury, anesthesia risks, and possible need for additional procedures. We discussed the typical post-operative recovery course. I explained that the likelihood of improvement of their symptoms is good.  The patient has elected to proceed with surgery. Schedule for Surgery Provided with written educational materials MORBID OBESITY WITH BMI OF 45.0-49.9, ADULT (278.01  E66.01) Impression: I congratulated her on her weight loss today. She has lost an additional 11 pounds since her last visit. Her total weight loss since surgery has been 53 pounds. We discussed the importance of routine daily activity. I encouraged her to continue to use her fit bit to use as an objective tool to measure her activity LAP-BAND SURGERY STATUS (V45.86  Z98.84) Impression: So far there appear to be no complications from surgery. She appears to be in the green zone for the most part. Therefore we did not do an adjustment today. We rediscussed proper eating techniques and behaviors in order to avoid episodes of regurgitation. Current Plans Instructions: Exercise daily - change up intensity while exercising. increase intensity for short time and then go back to normal pace and recover and then increase intensity and repeat  20-20-20 rule 1 small bite (dime size) chew 20 times wait 20-30 seconds between bite of food should take 20-30 minutes to eat a meal  put fork  down between bites of food use non-dominant hand to eat with do not eat and watch TV at same time use infant sized utensil  Try fish Avoid sodas, sweet tea

## 2014-07-28 ENCOUNTER — Encounter (HOSPITAL_COMMUNITY)
Admission: RE | Admit: 2014-07-28 | Discharge: 2014-07-28 | Disposition: A | Discharge status: Home / Self Care | Payer: BC Managed Care – PPO | Source: Ambulatory Visit | Attending: General Surgery | Admitting: General Surgery

## 2014-07-28 ENCOUNTER — Encounter (HOSPITAL_COMMUNITY): Payer: Self-pay

## 2014-07-28 DIAGNOSIS — Z01812 Encounter for preprocedural laboratory examination: Secondary | ICD-10-CM | POA: Insufficient documentation

## 2014-07-28 DIAGNOSIS — K8020 Calculus of gallbladder without cholecystitis without obstruction: Secondary | ICD-10-CM | POA: Insufficient documentation

## 2014-07-28 LAB — CBC
HEMATOCRIT: 43.4 % (ref 36.0–46.0)
Hemoglobin: 14.9 g/dL (ref 12.0–15.0)
MCH: 30.8 pg (ref 26.0–34.0)
MCHC: 34.3 g/dL (ref 30.0–36.0)
MCV: 89.9 fL (ref 78.0–100.0)
PLATELETS: 236 10*3/uL (ref 150–400)
RBC: 4.83 MIL/uL (ref 3.87–5.11)
RDW: 12.2 % (ref 11.5–15.5)
WBC: 10.6 10*3/uL — AB (ref 4.0–10.5)

## 2014-07-28 LAB — COMPREHENSIVE METABOLIC PANEL
ALT: 220 U/L — AB (ref 0–35)
ANION GAP: 11 (ref 5–15)
AST: 46 U/L — AB (ref 0–37)
Albumin: 4.5 g/dL (ref 3.5–5.2)
Alkaline Phosphatase: 84 U/L (ref 39–117)
BUN: 14 mg/dL (ref 6–23)
CALCIUM: 9.3 mg/dL (ref 8.4–10.5)
CHLORIDE: 102 mmol/L (ref 96–112)
CO2: 28 mmol/L (ref 19–32)
Creatinine, Ser: 0.91 mg/dL (ref 0.50–1.10)
GFR calc Af Amer: >90 mL/min (ref >90)
GFR, EST NON AFRICAN AMERICAN: 80 mL/min — AB (ref >90)
GLUCOSE: 86 mg/dL (ref 70–99)
POTASSIUM: 4.1 mmol/L (ref 3.5–5.1)
Sodium: 141 mmol/L (ref 135–145)
Total Bilirubin: 0.9 mg/dL (ref 0.3–1.2)
Total Protein: 7.8 g/dL (ref 6.0–8.3)

## 2014-07-28 LAB — HCG, SERUM, QUALITATIVE: Preg, Serum: NEGATIVE

## 2014-07-28 NOTE — Progress Notes (Signed)
Chest4/8/15 epic ekg 08/07/13 epic

## 2014-07-28 NOTE — Patient Instructions (Addendum)
Your procedure is scheduled on:  08/01/14  Friday  Report to [facility]- FOLLOW SIGNS TO SHORT STAY CENTER Short Stay Center at     0530  AM.   Call this number if you have problems the morning of surgery: [phone]        Do not eat food  Or drink :After Midnight. Thursday NIGHT   Take these medicines the morning of surgery with A SIP OF WATER: EFFEXOR   .  Contacts, dentures or partial plates, or metal hairpins  can not be worn to surgery. Your family will be responsible for glasses, dentures, hearing aides while you are in surgery  Leave suitcase in the car. After surgery it may be brought to your room.  For patients admitted to the hospital, checkout time is 11:00 AM day of  discharge.         [facility] IS NOT RESPONSIBLE FOR ANY VALUABLES  Patients discharged the day of surgery will not be allowed to drive home. IF going home the day of surgery, you must have a driver and someone to stay with you for the first 24 hours  Name and phone number of your driver:      mom                                                                                                                                   [facility] - Preparing for Surgery Before surgery, you can play an important role.  Because skin is not sterile, your skin needs to be as free of germs as possible.  You can reduce the number of germs on your skin by washing with CHG (chlorahexidine gluconate) soap before surgery.  CHG is an antiseptic cleaner which kills germs and bonds with the skin to continue killing germs even after washing. Please DO NOT use if you have an allergy to CHG or antibacterial soaps.  If your skin becomes reddened/irritated stop using the CHG and inform your nurse when you arrive at Short Stay. Do not shave (including legs and underarms) for at least 48 hours prior to the first CHG shower.  You may shave your face/neck. Please follow these instructions carefully:  1.  Shower  with CHG Soap the night before surgery and the  morning of Surgery.  2.  If you choose to wash your hair, wash your hair first as usual with your  normal  shampoo.  3.  After you shampoo, rinse your hair and body thoroughly to remove the  shampoo.                           4.  Use CHG as you would any other liquid soap.  You can apply chg directly  to the skin and wash  Gently with a scrungie or clean washcloth.  5.  Apply the CHG Soap to your body ONLY FROM THE NECK DOWN.   Do not use on face/ open                           Wound or open sores. Avoid contact with eyes, ears mouth and genitals (private parts).                       Wash face,  Genitals (private parts) with your normal soap.             6.  Wash thoroughly, paying special attention to the area where your surgery  will be performed.  7.  Thoroughly rinse your body with warm water from the neck down.  8.  DO NOT shower/wash with your normal soap after using and rinsing off  the CHG Soap.                9.  Pat yourself dry with a clean towel.            10.  Wear clean pajamas.            11.  Place clean sheets on your bed the night of your first shower and do not  sleep with pets. Day of Surgery : Do not apply any lotions/deodorants the morning of surgery.  Please wear clean clothes to the hospital/surgery center.  FAILURE TO FOLLOW THESE INSTRUCTIONS MAY RESULT IN THE CANCELLATION OF YOUR SURGERY PATIENT SIGNATURE_________________________________  NURSE SIGNATURE__________________________________  ________________________________________________________________________

## 2014-08-01 ENCOUNTER — Ambulatory Visit (HOSPITAL_COMMUNITY): Payer: BC Managed Care – PPO

## 2014-08-01 ENCOUNTER — Ambulatory Visit (HOSPITAL_COMMUNITY)
Admission: RE | Admit: 2014-08-01 | Discharge: 2014-08-01 | Disposition: A | Discharge status: Home / Self Care | Payer: BC Managed Care – PPO | Source: Ambulatory Visit | Attending: General Surgery | Admitting: General Surgery

## 2014-08-01 ENCOUNTER — Encounter (HOSPITAL_COMMUNITY)
Admission: RE | Disposition: A | Discharge status: Home / Self Care | Payer: Self-pay | Source: Ambulatory Visit | Attending: General Surgery

## 2014-08-01 ENCOUNTER — Ambulatory Visit (HOSPITAL_COMMUNITY): Payer: BC Managed Care – PPO | Admitting: Anesthesiology

## 2014-08-01 ENCOUNTER — Encounter (HOSPITAL_COMMUNITY): Payer: Self-pay | Admitting: *Deleted

## 2014-08-01 DIAGNOSIS — Z9884 Bariatric surgery status: Secondary | ICD-10-CM | POA: Diagnosis not present

## 2014-08-01 DIAGNOSIS — J45909 Unspecified asthma, uncomplicated: Secondary | ICD-10-CM | POA: Insufficient documentation

## 2014-08-01 DIAGNOSIS — Z6841 Body Mass Index (BMI) 40.0 and over, adult: Secondary | ICD-10-CM | POA: Diagnosis not present

## 2014-08-01 DIAGNOSIS — K8010 Calculus of gallbladder with chronic cholecystitis without obstruction: Secondary | ICD-10-CM | POA: Insufficient documentation

## 2014-08-01 DIAGNOSIS — Z419 Encounter for procedure for purposes other than remedying health state, unspecified: Secondary | ICD-10-CM

## 2014-08-01 DIAGNOSIS — K828 Other specified diseases of gallbladder: Secondary | ICD-10-CM | POA: Insufficient documentation

## 2014-08-01 DIAGNOSIS — K8020 Calculus of gallbladder without cholecystitis without obstruction: Secondary | ICD-10-CM | POA: Diagnosis present

## 2014-08-01 DIAGNOSIS — R740 Nonspecific elevation of levels of transaminase and lactic acid dehydrogenase [LDH]: Secondary | ICD-10-CM | POA: Insufficient documentation

## 2014-08-01 DIAGNOSIS — Z87891 Personal history of nicotine dependence: Secondary | ICD-10-CM | POA: Diagnosis not present

## 2014-08-01 DIAGNOSIS — F329 Major depressive disorder, single episode, unspecified: Secondary | ICD-10-CM | POA: Insufficient documentation

## 2014-08-01 HISTORY — PX: CHOLECYSTECTOMY: SHX55

## 2014-08-01 SURGERY — LAPAROSCOPIC CHOLECYSTECTOMY WITH INTRAOPERATIVE CHOLANGIOGRAM
Anesthesia: General

## 2014-08-01 MED ORDER — SODIUM CHLORIDE 0.9 % IJ SOLN: 3.0000 mL | Freq: Two times a day (BID) | INTRAMUSCULAR | Status: DC

## 2014-08-01 MED ORDER — MORPHINE SULFATE 10 MG/ML IJ SOLN: 1.0000 mg | INTRAMUSCULAR | Status: DC | PRN

## 2014-08-01 MED ORDER — OXYCODONE HCL 5 MG PO TABS: 5.0000 mg | ORAL_TABLET | ORAL | Status: AC | PRN

## 2014-08-01 MED ORDER — CEFOXITIN SODIUM 2 G IV SOLR
2.0000 g | INTRAVENOUS | Status: DC | PRN
  Administered 2014-08-01: 2 g via INTRAVENOUS

## 2014-08-01 MED ORDER — LACTATED RINGERS IV SOLN
INTRAVENOUS | Status: DC | PRN
  Administered 2014-08-01 (×2): via INTRAVENOUS

## 2014-08-01 MED ORDER — GLYCOPYRROLATE 0.2 MG/ML IJ SOLN
INTRAMUSCULAR | Status: AC
  Filled 2014-08-01: qty 3

## 2014-08-01 MED ORDER — FENTANYL CITRATE 0.05 MG/ML IJ SOLN
INTRAMUSCULAR | Status: AC
  Filled 2014-08-01: qty 5

## 2014-08-01 MED ORDER — ACETAMINOPHEN 650 MG RE SUPP
650.0000 mg | RECTAL | Status: DC | PRN
  Filled 2014-08-01: qty 1

## 2014-08-01 MED ORDER — ROCURONIUM BROMIDE 100 MG/10ML IV SOLN
INTRAVENOUS | Status: AC
  Filled 2014-08-01: qty 1

## 2014-08-01 MED ORDER — OXYCODONE HCL 5 MG PO TABS
5.0000 mg | ORAL_TABLET | ORAL | Status: DC | PRN
  Administered 2014-08-01: 5 mg via ORAL
  Filled 2014-08-01: qty 1

## 2014-08-01 MED ORDER — LIDOCAINE HCL (CARDIAC) 20 MG/ML IV SOLN
INTRAVENOUS | Status: DC | PRN
  Administered 2014-08-01: 60 mg via INTRAVENOUS

## 2014-08-01 MED ORDER — DEXAMETHASONE SODIUM PHOSPHATE 10 MG/ML IJ SOLN
INTRAMUSCULAR | Status: DC | PRN
  Administered 2014-08-01: 10 mg via INTRAVENOUS

## 2014-08-01 MED ORDER — LIDOCAINE HCL (CARDIAC) 20 MG/ML IV SOLN
INTRAVENOUS | Status: AC
  Filled 2014-08-01: qty 5

## 2014-08-01 MED ORDER — POTASSIUM CHLORIDE IN NACL 20-0.9 MEQ/L-% IV SOLN: INTRAVENOUS | Status: DC

## 2014-08-01 MED ORDER — HYDROMORPHONE HCL 1 MG/ML IJ SOLN: 0.2500 mg | INTRAMUSCULAR | Status: DC | PRN

## 2014-08-01 MED ORDER — MIDAZOLAM HCL 5 MG/5ML IJ SOLN
INTRAMUSCULAR | Status: DC | PRN
  Administered 2014-08-01: 2 mg via INTRAVENOUS

## 2014-08-01 MED ORDER — METOCLOPRAMIDE HCL 5 MG/ML IJ SOLN
INTRAMUSCULAR | Status: DC | PRN
  Administered 2014-08-01: 10 mg via INTRAVENOUS

## 2014-08-01 MED ORDER — FENTANYL CITRATE 0.05 MG/ML IJ SOLN
INTRAMUSCULAR | Status: DC | PRN
  Administered 2014-08-01: 100 ug via INTRAVENOUS
  Administered 2014-08-01 (×2): 50 ug via INTRAVENOUS
  Administered 2014-08-01: 25 ug via INTRAVENOUS
  Administered 2014-08-01: 50 ug via INTRAVENOUS
  Administered 2014-08-01: 25 ug via INTRAVENOUS
  Administered 2014-08-01: 100 ug via INTRAVENOUS
  Administered 2014-08-01: 25 ug via INTRAVENOUS
  Administered 2014-08-01: 50 ug via INTRAVENOUS
  Administered 2014-08-01: 25 ug via INTRAVENOUS

## 2014-08-01 MED ORDER — IOHEXOL 300 MG/ML  SOLN
INTRAMUSCULAR | Status: DC | PRN
Start: 2014-08-01 — End: 2014-08-01
  Administered 2014-08-01: 5 mL

## 2014-08-01 MED ORDER — PROPOFOL 10 MG/ML IV BOLUS
INTRAVENOUS | Status: AC
  Filled 2014-08-01: qty 20

## 2014-08-01 MED ORDER — BUPIVACAINE-EPINEPHRINE (PF) 0.25% -1:200000 IJ SOLN
INTRAMUSCULAR | Status: AC
  Filled 2014-08-01: qty 30

## 2014-08-01 MED ORDER — ROCURONIUM BROMIDE 100 MG/10ML IV SOLN
INTRAVENOUS | Status: DC | PRN
  Administered 2014-08-01: 40 mg via INTRAVENOUS

## 2014-08-01 MED ORDER — ACETAMINOPHEN 325 MG PO TABS: 650.0000 mg | ORAL_TABLET | ORAL | Status: DC | PRN

## 2014-08-01 MED ORDER — SODIUM CHLORIDE 0.9 % IJ SOLN: 3.0000 mL | INTRAMUSCULAR | Status: DC | PRN

## 2014-08-01 MED ORDER — ONDANSETRON HCL 4 MG/2ML IJ SOLN
INTRAMUSCULAR | Status: DC | PRN
Start: 2014-08-01 — End: 2014-08-01
  Administered 2014-08-01: 4 mg via INTRAVENOUS

## 2014-08-01 MED ORDER — SODIUM CHLORIDE 0.9 % IV SOLN: 250.0000 mL | INTRAVENOUS | Status: DC | PRN

## 2014-08-01 MED ORDER — LACTATED RINGERS IR SOLN
Status: DC | PRN
  Administered 2014-08-01: 1000 mL

## 2014-08-01 MED ORDER — ONDANSETRON HCL 4 MG/2ML IJ SOLN
INTRAMUSCULAR | Status: AC
  Filled 2014-08-01: qty 2

## 2014-08-01 MED ORDER — BUPIVACAINE-EPINEPHRINE 0.25% -1:200000 IJ SOLN
INTRAMUSCULAR | Status: DC | PRN
  Administered 2014-08-01: 30 mL

## 2014-08-01 MED ORDER — NEOSTIGMINE METHYLSULFATE 10 MG/10ML IV SOLN
INTRAVENOUS | Status: AC
  Filled 2014-08-01: qty 1

## 2014-08-01 MED ORDER — BUPIVACAINE HCL (PF) 0.25 % IJ SOLN
INTRAMUSCULAR | Status: AC
  Filled 2014-08-01: qty 30

## 2014-08-01 MED ORDER — MIDAZOLAM HCL 2 MG/2ML IJ SOLN
INTRAMUSCULAR | Status: AC
  Filled 2014-08-01: qty 2

## 2014-08-01 MED ORDER — GLYCOPYRROLATE 0.2 MG/ML IJ SOLN
INTRAMUSCULAR | Status: DC | PRN
  Administered 2014-08-01: .8 mg via INTRAVENOUS

## 2014-08-01 MED ORDER — CEFOXITIN SODIUM 2 G IV SOLR
INTRAVENOUS | Status: AC
  Filled 2014-08-01: qty 2

## 2014-08-01 MED ORDER — NEOSTIGMINE METHYLSULFATE 10 MG/10ML IV SOLN
INTRAVENOUS | Status: DC | PRN
  Administered 2014-08-01: 5 mg via INTRAVENOUS

## 2014-08-01 MED ORDER — DEXTROSE 5 % IV SOLN: 2.0000 g | INTRAVENOUS | Status: DC

## 2014-08-01 MED ORDER — PROPOFOL 10 MG/ML IV BOLUS
INTRAVENOUS | Status: DC | PRN
Start: 2014-08-01 — End: 2014-08-01
  Administered 2014-08-01: 180 mg via INTRAVENOUS

## 2014-08-01 SURGICAL SUPPLY — 38 items
APPLIER CLIP 5 13 M/L LIGAMAX5 (MISCELLANEOUS) ×3
APPLIER CLIP ROT 10 11.4 M/L (STAPLE)
CABLE HIGH FREQUENCY MONO STRZ (ELECTRODE) ×3 IMPLANT
CHLORAPREP W/TINT 26ML (MISCELLANEOUS) ×3 IMPLANT
CLIP APPLIE 5 13 M/L LIGAMAX5 (MISCELLANEOUS) ×1 IMPLANT
CLIP APPLIE ROT 10 11.4 M/L (STAPLE) IMPLANT
COVER MAYO STAND STRL (DRAPES) ×3 IMPLANT
DECANTER SPIKE VIAL GLASS SM (MISCELLANEOUS) IMPLANT
DRAPE C-ARM 42X120 X-RAY (DRAPES) ×3 IMPLANT
DRAPE LAPAROSCOPIC ABDOMINAL (DRAPES) ×3 IMPLANT
ELECT REM PT RETURN 9FT ADLT (ELECTROSURGICAL) ×3
ELECTRODE REM PT RTRN 9FT ADLT (ELECTROSURGICAL) ×1 IMPLANT
GLOVE BIO SURGEON STRL SZ7.5 (GLOVE) ×24 IMPLANT
GLOVE BIOGEL M STRL SZ7.5 (GLOVE) ×3 IMPLANT
GLOVE BIOGEL PI IND STRL 7.0 (GLOVE) ×1 IMPLANT
GLOVE BIOGEL PI INDICATOR 7.0 (GLOVE) ×2
GOWN STRL REUS W/TWL XL LVL3 (GOWN DISPOSABLE) ×9 IMPLANT
HEMOSTAT SNOW SURGICEL 2X4 (HEMOSTASIS) ×3 IMPLANT
KIT BASIN OR (CUSTOM PROCEDURE TRAY) ×3 IMPLANT
LIQUID BAND (GAUZE/BANDAGES/DRESSINGS) ×3 IMPLANT
NS IRRIG 1000ML POUR BTL (IV SOLUTION) ×3 IMPLANT
PEN SKIN MARKING BROAD (MISCELLANEOUS) ×3 IMPLANT
POUCH RETRIEVAL ECOSAC 10 (ENDOMECHANICALS) IMPLANT
POUCH RETRIEVAL ECOSAC 10MM (ENDOMECHANICALS)
POUCH SPECIMEN RETRIEVAL 10MM (ENDOMECHANICALS) ×3 IMPLANT
SCISSORS LAP 5X35 DISP (ENDOMECHANICALS) ×3 IMPLANT
SET CHOLANGIOGRAPH MIX (MISCELLANEOUS) ×3 IMPLANT
SET IRRIG TUBING LAPAROSCOPIC (IRRIGATION / IRRIGATOR) ×3 IMPLANT
SLEEVE XCEL OPT CAN 5 100 (ENDOMECHANICALS) ×6 IMPLANT
SUT MNCRL AB 4-0 PS2 18 (SUTURE) ×3 IMPLANT
SUT VIC AB 2-0 UR6 27 (SUTURE) IMPLANT
SUT VICRYL 0 UR6 27IN ABS (SUTURE) ×3 IMPLANT
TOWEL OR 17X26 10 PK STRL BLUE (TOWEL DISPOSABLE) ×3 IMPLANT
TOWEL OR NON WOVEN STRL DISP B (DISPOSABLE) ×3 IMPLANT
TRAY LAPAROSCOPIC (CUSTOM PROCEDURE TRAY) ×3 IMPLANT
TROCAR BLADELESS OPT 5 100 (ENDOMECHANICALS) ×3 IMPLANT
TROCAR XCEL BLUNT TIP 100MML (ENDOMECHANICALS) ×3 IMPLANT
TROCAR XCEL NON-BLD 11X100MML (ENDOMECHANICALS) IMPLANT

## 2014-08-01 NOTE — Op Note (Signed)
Sherry LoaCrystal Gordon [id number] May 10, 1975 08/01/2014  Laparoscopic Cholecystectomy with IOC Procedure Note  Indications: This patient presents with symptomatic gallbladder disease and will undergo laparoscopic cholecystectomy. She has also had a prior lap band placed.  Pre-operative Diagnosis: symptomatic cholelithiasis, elevated liver transaminases, h/o lap adjustable gastric band placement  Post-operative Diagnosis: Calculus of gallbladder with other cholecystitis, without mention of obstruction; elevated liver transaminases, h/o lap adjustable gastric band placement  Surgeon: Atilano InaWILSON,Tekeshia Klahr M   Assistants: none  Anesthesia: General endotracheal anesthesia  ASA Class: 3  Procedure Details  The patient was seen again in the Holding Room. The risks, benefits, complications, treatment options, and expected outcomes were discussed with the patient. The possibilities of reaction to medication, pulmonary aspiration, perforation of viscus, bleeding, recurrent infection, finding a normal gallbladder, the need for additional procedures, failure to diagnose a condition, the possible need to convert to an open procedure, and creating a complication requiring transfusion or operation were discussed with the patient. The likelihood of improving the patient's symptoms with return to their baseline status is good.  The patient and/or family concurred with the proposed plan, giving informed consent. The site of surgery properly noted. The patient was taken to Operating Room, identified as Sherry Gordon and the procedure verified as Laparoscopic Cholecystectomy with Intraoperative Cholangiogram. A Time Out was held and the above information confirmed. Antibiotic prophylaxis was administered.   Prior to the induction of general anesthesia, antibiotic prophylaxis was administered. General endotracheal anesthesia was then administered and tolerated well. After the induction, the abdomen was prepped with Chloraprep and  draped in the sterile fashion. The patient was positioned in the supine position.  Local anesthetic agent was injected into the skin near the umbilicus and an incision made. We dissected down to the abdominal fascia with blunt dissection.  The fascia was incised vertically and we entered the peritoneal cavity bluntly.  A pursestring suture of 0-Vicryl was placed around the fascial opening.  The Hasson cannula was inserted and secured with the stay suture.  Pneumoperitoneum was then created with CO2 and tolerated well without any adverse changes in the patient's vital signs. An 5-mm port was placed in the subxiphoid position.  Two 5-mm ports were placed in the right upper quadrant. All skin incisions were infiltrated with a local anesthetic agent before making the incision and placing the trocars.   We positioned the patient in reverse Trendelenburg, tilted slightly to the patient's left.  The gallbladder was identified, the fundus grasped and retracted cephalad. Adhesions were lysed bluntly and with the electrocautery where indicated, taking care not to injure any adjacent organs or viscus. The infundibulum was grasped and retracted laterally, exposing the peritoneum overlying the triangle of Calot. This was then divided and exposed in a blunt fashion. A critical view of the cystic duct and cystic artery was obtained.  The cystic duct was clearly identified and bluntly dissected circumferentially. The cystic duct was ligated with a clip distally.   An incision was made in the cystic duct and the [facility] cholangiogram catheter introduced. The catheter was secured using a clip. A cholangiogram was then obtained which showed good visualization of the distal and proximal biliary tree with no sign of filling defects or obstruction.  Contrast flowed easily into the duodenum. The catheter was then removed.   The cystic duct was then ligated with clips and divided. The cystic artery which had been identified and  dissected free and previously ligated with clips was divided as well.   The gallbladder was dissected  from the liver bed in retrograde fashion with the electrocautery. The gallbladder was removed and placed in an Endocatch sac.  The gallbladder and Endocatch sac were then removed through the umbilical port site. The liver bed was irrigated and inspected. Hemostasis was achieved with the electrocautery. Copious irrigation was utilized and was repeatedly aspirated until clear.  The LAGB appeared to be in good position and orientation when viewed.  The pursestring suture was used to close the umbilical fascia.  An additional interrupted 0 vicryl suture was placed at the umbilical fascia.  We again inspected the right upper quadrant for hemostasis.  The umbilical closure was inspected and there was no air leak and nothing trapped within the closure. Pneumoperitoneum was released as we removed the trocars.  4-0 Monocryl was used to close the skin.   Liquid band exceed was applied. The patient was then extubated and brought to the recovery room in stable condition. Instrument, sponge, and needle counts were correct at closure and at the conclusion of the case.   Findings: Chronic Cholecystitis with Cholelithiasis; enlarged liver but did not really appear steatotic; LAGB in good position  Estimated Blood Loss: Minimal         Drains: none         Specimens: Gallbladder           Complications: None; patient tolerated the procedure well.         Disposition: PACU - hemodynamically stable.         Condition: stable  Mary Sella. Andrey Campanile, MD, FACS General, Bariatric, & Minimally Invasive Surgery [facility], [place]

## 2014-08-01 NOTE — Anesthesia Postprocedure Evaluation (Signed)
  Anesthesia Post-op Note  Patient: Sherry Gordon  Procedure(s) Performed: Procedure(s) (LRB): LAPAROSCOPIC CHOLECYSTECTOMY WITH INTRAOPERATIVE CHOLANGIOGRAM (N/A)  Patient Location: PACU  Anesthesia Type: General  Level of Consciousness: awake and alert   Airway and Oxygen Therapy: Patient Spontanous Breathing  Post-op Pain: mild  Post-op Assessment: Post-op Vital signs reviewed, Patient's Cardiovascular Status Stable, Respiratory Function Stable, Patent Airway and No signs of Nausea or vomiting  Last Vitals:  Filed Vitals:   08/01/14 1000  BP: 111/62  Pulse: 62  Temp: 36.8 C  Resp: 12    Post-op Vital Signs: stable   Complications: No apparent anesthesia complications

## 2014-08-01 NOTE — Discharge Instructions (Signed)
[facility] °LAPAROSCOPIC SURGERY: POST OP INSTRUCTIONS °Always review your discharge instruction sheet given to you by the facility where your surgery was performed. °IF YOU HAVE DISABILITY OR FAMILY LEAVE FORMS, YOU MUST BRING THEM TO THE OFFICE FOR PROCESSING.   °DO NOT GIVE THEM TO YOUR DOCTOR. ° °1. A prescription for pain medication may be given to you upon discharge.  Take your pain medication as prescribed, if needed.  If narcotic pain medicine is not needed, then you may take acetaminophen (Tylenol) or ibuprofen (Advil) as needed. °2. Take your usually prescribed medications unless otherwise directed. °3. If you need a refill on your pain medication, please contact your pharmacy.  They will contact our office to request authorization. Prescriptions will not be filled after 5pm or on week-ends. °4. You should follow a light diet the first few days after arrival home, such as soup and crackers, etc.  Be sure to include lots of fluids daily. °5. Most patients will experience some swelling and bruising in the area of the incisions.  Ice packs will help.  Swelling and bruising can take several days to resolve.  °6. It is common to experience some constipation if taking pain medication after surgery.  Increasing fluid intake and taking a stool softener (such as Colace) will usually help or prevent this problem from occurring.  A mild laxative (Milk of Magnesia or Miralax) should be taken according to package instructions if there are no bowel movements after 48 hours. °7. Unless discharge instructions indicate otherwise, you may remove your bandages 24-48 hours after surgery, and you may shower at that time.  You may have steri-strips (small skin tapes) in place directly over the incision.  These strips should be left on the skin for 7-10 days.  If your surgeon used skin glue on the incision, you may shower in 24 hours.  The glue will flake off over the next 2-3 weeks.  Any sutures or  staples will be removed at the office during your follow-up visit. °8. ACTIVITIES:  You may resume regular (light) daily activities beginning the next day--such as daily self-care, walking, climbing stairs--gradually increasing activities as tolerated.  You may have sexual intercourse when it is comfortable.  Refrain from any heavy lifting or straining until approved by your doctor. °a. You may drive when you are no longer taking prescription pain medication, you can comfortably wear a seatbelt, and you can safely maneuver your car and apply brakes. °9. You should see your doctor in the office for a follow-up appointment approximately 2-3 weeks after your surgery.  Make sure that you call for this appointment within a day or two after you arrive home to insure a convenient appointment time. °10. OTHER INSTRUCTIONS:  °WHEN TO CALL YOUR DOCTOR: °1. Fever over 101.0 °2. Inability to urinate °3. Continued bleeding from incision. °4. Increased pain, redness, or drainage from the incision. °5. Increasing abdominal pain ° °The clinic staff is available to answer your questions during regular business hours.  Please don’t hesitate to call and ask to speak to one of the nurses for clinical concerns.  If you have a medical emergency, go to the nearest emergency room or call 911.  A surgeon from [facility] is always on call at the hospital. °[address], [address], [place], [place]  [address] ? [address] [address], [place], [place]   [address] °[phone] ? [phone] ? FAX [phone] °Web site: [email or web address] ° °

## 2014-08-01 NOTE — Anesthesia Preprocedure Evaluation (Signed)
Anesthesia Evaluation  Patient identified by MRN, date of birth, ID band Patient awake    Reviewed: Allergy & Precautions, NPO status , Patient's Chart, lab work & pertinent test results  Airway Mallampati: II  TM Distance: >3 FB Neck ROM: Full    Dental no notable dental hx.    Pulmonary former smoker,  breath sounds clear to auscultation  Pulmonary exam normal       Cardiovascular negative cardio ROS  Rhythm:Regular Rate:Normal     Neuro/Psych PSYCHIATRIC DISORDERS Anxiety Depression negative neurological ROS     GI/Hepatic negative GI ROS, Neg liver ROS,   Endo/Other  Morbid obesity  Renal/GU negative Renal ROS  negative genitourinary   Musculoskeletal negative musculoskeletal ROS (+)   Abdominal (+) + obese,   Peds negative pediatric ROS (+)  Hematology negative hematology ROS (+)   Anesthesia Other Findings   Reproductive/Obstetrics negative OB ROS                             Anesthesia Physical Anesthesia Plan  ASA: III  Anesthesia Plan: General   Post-op Pain Management:    Induction: Intravenous  Airway Management Planned:   Additional Equipment:   Intra-op Plan:   Post-operative Plan: Extubation in OR  Informed Consent: I have reviewed the patients History and Physical, chart, labs and discussed the procedure including the risks, benefits and alternatives for the proposed anesthesia with the patient or authorized representative who has indicated his/her understanding and acceptance.   Dental advisory given  Plan Discussed with: CRNA  Anesthesia Plan Comments:         Anesthesia Quick Evaluation

## 2014-08-01 NOTE — Transfer of Care (Signed)
Immediate Anesthesia Transfer of Care Note  Patient: Sherry Gordon  Procedure(s) Performed: Procedure(s): LAPAROSCOPIC CHOLECYSTECTOMY WITH INTRAOPERATIVE CHOLANGIOGRAM (N/A)  Patient Location: PACU  Anesthesia Type:General  Level of Consciousness: awake, alert , oriented and patient cooperative  Airway & Oxygen Therapy: Patient Spontanous Breathing and Patient connected to face mask oxygen  Post-op Assessment: Report given to RN  Post vital signs: Reviewed and stable  Last Vitals:  Filed Vitals:   08/01/14 0510  BP: 134/84  Pulse: 83  Temp: 36.7 C  Resp: 18    Complications: No apparent anesthesia complications

## 2014-08-01 NOTE — Interval H&P Note (Signed)
History and Physical Interval Note:  08/01/2014 7:17 AM  Sherry Gordon  has presented today for surgery, with the diagnosis of Cholelithiasis  The various methods of treatment have been discussed with the patient and family. After consideration of risks, benefits and other options for treatment, the patient has consented to  Procedure(s): LAPAROSCOPIC CHOLECYSTECTOMY WITH INTRAOPERATIVE CHOLANGIOGRAM (N/A) as a surgical intervention .  The patient's history has been reviewed, patient examined, no change in status, stable for surgery.  I have reviewed the patient's chart and labs.  Questions were answered to the patient's satisfaction.    Mary SellaEric M. Andrey CampanileWilson, MD, FACS General, Bariatric, & Minimally Invasive Surgery [facility], [place]   [facility] FWILSON,Samie Reasons M

## 2014-08-01 NOTE — H&P (View-Only) (Signed)
Sherry Gordon 07/09/2014 9:15 AM Location: [facility] Patient #: [id number] DOB: Sep 19, 1975 Married / Language: English / Race: [place] Female  History of Present Illness Sherry Gordon(Sherry Gordon M. Ranveer Wahlstrom MD; 07/12/2014 12:36 PM) Patient words: lap band f/u.  The patient is a 39 year old female is here for lapband followup. 39 year old morbidly obese Caucasian female comes in for followup after undergoing laparoscopic adjustable gastric band placement on 12/03/2013. Her preoperative weight was 317 pounds. Her initial visit weight was 322 pounds.  I last saw her in the office on October 15. At that time her weight was 280 pounds pounds. She called in last week complaining of right sided discomfort especially after meals. These episodes were self-limited; however, she had one severe episode where the pain lasted for several hours and was quite intense almost causing her to go to the emergency room. She developed nausea and vomiting and the pain resolved so she did not go to the emergency room. Most of these episodes last less than one hour. She denies any jaundice or acholic stools. She denies any NSAID use. She underwent abdominal ultrasound on March 7 which demonstrated multiple gallstones with normal wall thickness. Common bile duct diameter was 3.3 mm. Liver was within normal limits. She also underwent blood work which revealed an ALT level of 159 BUNs slightly elevated at 24 and normal CBC.  She denies any fever, chills, regurgitation, nighttime cough, or reflux. She is averaging 9000 steps a day. She is mainly walking. She reports that she is not hungry between meals.   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 Effexor (75MG  Tablet, Oral) Active.  Social History Sherry Gordon(Sherry Gordon M Sherry Hewlett, MD; 07/12/2014 12:34 PM) No drug use Tobacco use Former smoker. Alcohol use Occasional alcohol use. Caffeine use Coffee.  Family History Sherry Gordon(Sherry Gordon M Sherry Otte, MD; 07/12/2014 12:34 PM) Depression Family Members In General, Father, Sister. Diabetes Mellitus Family Members In General. Heart disease in female family member before age 37 Respiratory Condition Father.  Pregnancy / Birth History Sherry Gordon(Sherry Gordon M Sherry Scannell, MD; 07/12/2014 12:34 PM) Contraceptive History Intrauterine device. Gravida 1 Irregular periods Age at menarche 14 years. Maternal age 37-30 Para 1  Vitals (Sherry Eversole LPN; 6/5/78463/01/2015 9:629:42 AM) 07/09/2014 9:41 AM Weight: 269 lb Height: 65.5in Body Surface Area: 2.37 m Body Mass Index: 44.08 kg/m Pulse: 86 (Regular)  BP: 132/84 (Sitting, Left Arm, Standard)    Physical Exam Sherry Gordon(Chelsea Pedretti M. Akim Watkinson MD; 07/12/2014 12:31 PM) General Mental Status-Alert. General Appearance-Consistent with stated age. Hydration-Well hydrated. Voice-Normal. Note: Morbidly obese   Head and Neck Head-normocephalic, atraumatic with no lesions or palpable masses. Trachea-midline. Thyroid Gland Characteristics - normal size and consistency.  Eye Eyeball - Bilateral-Extraocular movements intact. Sclera/Conjunctiva - Bilateral-No scleral icterus.  Chest and Lung Exam Chest and lung exam reveals -quiet, even and easy respiratory effort with no use of accessory muscles and  on auscultation, normal breath sounds, no adventitious sounds and normal vocal resonance.  Inspection Chest Wall - Normal. Back - normal.  Breast - Did not examine.  Cardiovascular Cardiovascular examination reveals -normal heart sounds, regular rate and rhythm with no murmurs and normal pedal pulses bilaterally.  Abdomen Inspection Inspection of the abdomen reveals - No Hernias. Skin - Scar - Note: Well-healed trocar site. Palpation/Percussion Palpation and Percussion of the abdomen reveal - Soft, Non Tender, No Rebound tenderness, No Rigidity (guarding) and No hepatosplenomegaly. Note: Palpable port in the right mid abdomen. Auscultation Auscultation of the abdomen reveals - Bowel sounds normal.  Peripheral Vascular Upper Extremity Palpation - Pulses bilaterally normal.  Neurologic Neurologic evaluation reveals -alert and oriented x 3 with no impairment of recent or remote memory. Mental Status-Normal.  Neuropsychiatric The patient's mood and affect are described as -normal. Judgment and Insight-insight is appropriate concerning matters relevant to self.  Musculoskeletal Normal Exam - Left-Upper Extremity Strength Normal and Lower Extremity Strength Normal. Normal Exam - Right-Upper Extremity Strength Normal and Lower Extremity Strength Normal.  Lymphatic Head & Neck  General Head & Neck Lymphatics: Bilateral - Description - Normal. Axillary - Did not examine. Femoral & Inguinal - Did not examine.    Assessment & Plan Sherry Gordon M. Brynja Marker MD; 07/12/2014 12:36 PM) SYMPTOMATIC CHOLELITHIASIS (574.20  K80.20) Impression: Her preoperative abdominal ultrasound prior to lap band surgery showed no gallstones. She appears to have developed gallstones probably as result of her weight loss. Current Plans  I believe the patient's symptoms are consistent with gallbladder disease.  We discussed gallbladder disease. The patient was given Agricultural engineer. We discussed  non-operative and operative management. We discussed the signs & symptoms of acute cholecystitis  I discussed laparoscopic cholecystectomy with IOC in detail. The patient was given educational material as well as diagrams detailing the procedure. We discussed the risks and benefits of a laparoscopic cholecystectomy including, but not limited to bleeding, infection, injury to surrounding structures such as the intestine or liver, bile leak, retained gallstones, need to convert to an open procedure, prolonged diarrhea, blood clots such as DVT, common bile duct injury, anesthesia risks, and possible need for additional procedures. We discussed the typical post-operative recovery course. I explained that the likelihood of improvement of their symptoms is good.  The patient has elected to proceed with surgery. Schedule for Surgery Provided with written educational materials MORBID OBESITY WITH BMI OF 45.0-49.9, ADULT (278.01  E66.01) Impression: I congratulated her on her weight loss today. She has lost an additional 11 pounds since her last visit. Her total weight loss since surgery has been 53 pounds. We discussed the importance of routine daily activity. I encouraged her to continue to use her fit bit to use as an objective tool to measure her activity LAP-BAND SURGERY STATUS (V45.86  Z98.84) Impression: So far there appear to be no complications from surgery. She appears to be in the green zone for the most part. Therefore we did not do an adjustment today. We rediscussed proper eating techniques and behaviors in order to avoid episodes of regurgitation. Current Plans Instructions: Exercise daily - change up intensity while exercising. increase intensity for short time and then go back to normal pace and recover and then increase intensity and repeat  20-20-20 rule 1 small bite (dime size) chew 20 times wait 20-30 seconds between bite of food should take 20-30 minutes to eat a meal  put fork  down between bites of food use non-dominant hand to eat with do not eat and watch TV at same time use infant sized utensil  Try fish Avoid sodas, sweet tea

## 2014-08-01 NOTE — Progress Notes (Signed)
Completed 3 day treatment with Antibiotics for ear infection .

## 2014-08-04 ENCOUNTER — Encounter (HOSPITAL_COMMUNITY): Payer: Self-pay | Admitting: General Surgery

## 2014-08-14 ENCOUNTER — Encounter: Payer: BC Managed Care – PPO | Attending: General Surgery | Admitting: Dietician

## 2014-08-14 VITALS — Ht 65.0 in | Wt 262.5 lb

## 2014-08-14 DIAGNOSIS — Z6841 Body Mass Index (BMI) 40.0 and over, adult: Secondary | ICD-10-CM | POA: Insufficient documentation

## 2014-08-14 DIAGNOSIS — Z713 Dietary counseling and surveillance: Secondary | ICD-10-CM | POA: Diagnosis not present

## 2014-08-14 NOTE — Progress Notes (Signed)
Follow-up visit:  8.5 months Post-Operative LAGB Surgery  Medical Nutrition Therapy:  Appt start time: 850 end time: 935  Primary concerns today: Post-operative Bariatric Surgery Nutrition Management.   Camry returns today having lost another 7 pounds. She had her gallbladder removed 2 weeks ago and reports she is still "experimenting" with foods she can tolerate. Her boyfriend recently moved in and brings foods into the house that are tempting for her. Zoiey has been cooking more meals at home and having family meals. However, she doesn't feel like she has been getting enough fluid and protein since cholecystectomy.   Surgery date: 12/03/13 Surgery type: [facility]AGB [facility] lbs on 09/07/13 (326 lbs per patient report) Weight today: 262.5 lbs Weight change: 7.5 lbs Total weight lost: 63.5 lbs  TANITA  BODY COMP RESULTS  11/18/13 12/17/13 01/20/14 03/24/14 07/01/14 08/14/14   BMI (kg/m^2) 53.4 50.6 47.9 45.9 44.9 43.7   Fat Mass (lbs) 179.5 158.0 146 139.5 128.5 133.5   Fat Free Mass (lbs) 141.5 146.0 142 136.5 141.5 129   Total Body Water (lbs) 103.5 107.0 104 100 103.5 94.5    Preferred Learning Style:   No preference indicated  Learning Readiness:   Ready  24-hr recall: B (AM): coffee with protein shake (15-20g) Snk (AM):  Yogurt (12g) L (PM): tuna packet (14g) Snk (PM): cheese stick (7g) D (PM): chicken or tacos without the shell (21g) Snk (PM):   Fluid intake: at least 64 oz of water (sometimes with flavor) Estimated total protein intake: 70+ grams per day  Medications: see list Supplementation: taking; Calcium inconsistent but drinks milk regularly and has a lot of cheese and yogurt  Using straws: occasionally, no gas pain Drinking while eating: no Hair loss: very little Carbonated beverages: 1-2 sips of diet soda  N/V/D/C: vomiting; having RUQ pain Dumping syndrome: no Last Lap-Band fill: 2 cc's on 12/27/2013  Recent physical activity:  BELT, walking  daily  Progress Towards Goal(s):  In progress.   Nutritional Diagnosis:  [place]-3.3 Overweight/obesity related to past poor dietary habits and physical inactivity as evidenced by patient w/ recent LAGB surgery following dietary guidelines for continued weight loss.     Intervention:  Nutrition counseling provided.  Teaching Method Utilized: Visual Auditory  Barriers to learning/adherence to lifestyle change: none  Demonstrated degree of understanding via:  Teach Back   Monitoring/Evaluation:  Dietary intake, exercise, lap band fills, and body weight. Follow up in 2 months for 10.5 month post-op visit.

## 2014-08-14 NOTE — Patient Instructions (Addendum)
-  Talk to boyfriend about keeping junk foods at his work or in his car -Avoid high-fat foods -Work on increasing fluid and protein   Surgery date: 12/03/13 Surgery [facility]B [facility] lbs on 09/07/13 (326 lbs per patient report) Weight today: 262.5 lbs Weight change: 7.5 lbs Total weight lost: 63.5 lbs

## 2014-10-16 ENCOUNTER — Encounter: Payer: BC Managed Care – PPO | Attending: General Surgery | Admitting: Dietician

## 2014-10-16 ENCOUNTER — Encounter: Payer: Self-pay | Admitting: Dietician

## 2014-10-16 VITALS — Ht 65.0 in | Wt 272.5 lb

## 2014-10-16 DIAGNOSIS — Z6841 Body Mass Index (BMI) 40.0 and over, adult: Secondary | ICD-10-CM | POA: Insufficient documentation

## 2014-10-16 DIAGNOSIS — Z713 Dietary counseling and surveillance: Secondary | ICD-10-CM | POA: Diagnosis not present

## 2014-10-16 NOTE — Patient Instructions (Signed)
-  Talk to boyfriend about keeping junk foods at his work or in his car -Avoid high-fat foods -Work on increasing fluid and protein  -Work on preparing healthy meals for your family and modify yours -Work on developing a routine during the summer -Make an appointment for a band fill -Consider seeing a counselor

## 2014-10-16 NOTE — Progress Notes (Signed)
Follow-up visit:  10 months Post-Operative LAGB Surgery  Medical Nutrition Therapy:  Appt start time: 800 end time: 840  Primary concerns today: Post-operative Bariatric Surgery Nutrition Management.   Sherry Gordon returns today having gained several pounds. She knows she hasn't been making good food choices. She states she has been eating "normally." Since her boyfriend moved in with Sherry Gordon and her son, Sherry Gordon has been preparing more high-fat meals. She is also not feeling much restriction with her band. She is also concerned that her boyfriend does not want her to lose more weight.   Goals: -Work on preparing healthy meals for your family and modify yours -Work on developing a routine during the summer -Make an appointment for a band fill -Consider seeing a counselor  Surgery date: 12/03/13 Surgery type: LAGB Start weight at [facility]: 317.5 lbs on 09/07/13 (326 lbs per patient report) Weight today: 272.5 Weight change: 10 lbs gain Total weight lost: 53.5 lbs  TANITA  BODY COMP RESULTS  11/18/13 12/17/13 01/20/14 03/24/14 07/01/14 08/14/14 10/16/14   BMI (kg/m^2) 53.4 50.6 47.9 45.9 44.9 43.7 45.3   Fat Mass (lbs) 179.5 158.0 146 139.5 128.5 133.5 141   Fat Free Mass (lbs) 141.5 146.0 142 136.5 141.5 129 131.5   Total Body Water (lbs) 103.5 107.0 104 100 103.5 94.5 96.5    Preferred Learning Style:   No preference indicated  Learning Readiness:   Ready  24-hr recall: B (AM): coffee with protein shake (15-20g) Snk (AM):  Yogurt (12g) L (PM): tuna packet (14g) Snk (PM): cheese stick (7g) D (PM): Nachos OR breakfast casserole  Snk (PM):   Fluid intake: at least 64 oz of water (sometimes with flavor) Estimated total protein intake: 70+ grams per day  Medications: see list Supplementation: taking; Calcium inconsistent but drinks milk regularly and has a lot of cheese and yogurt  Using straws: occasionally, no gas pain Drinking while eating: no Hair loss: very little Carbonated  beverages: 1-2 sips of diet soda  N/V/D/C: vomiting; having RUQ pain Dumping syndrome: no Last Lap-Band fill: 2 cc's on 12/27/2013  Recent physical activity:  none  Progress Towards Goal(s):  In progress.   Nutritional Diagnosis:  [place]-3.3 Overweight/obesity related to past poor dietary habits and physical inactivity as evidenced by patient w/ recent LAGB surgery following dietary guidelines for continued weight loss.     Intervention:  Nutrition counseling provided.  Handout provided: Counseling resources  Teaching Method Utilized: Visual Auditory  Barriers to learning/adherence to lifestyle change: none  Demonstrated degree of understanding via:  Teach Back   Monitoring/Evaluation:  Dietary intake, exercise, lap band fills, and body weight. Follow up in 2 months for 12 month post-op visit.

## 2014-12-16 ENCOUNTER — Encounter: Payer: BC Managed Care – PPO | Attending: General Surgery | Admitting: Dietician

## 2014-12-16 ENCOUNTER — Encounter: Payer: Self-pay | Admitting: Dietician

## 2014-12-16 VITALS — Ht 65.0 in | Wt 270.5 lb

## 2014-12-16 DIAGNOSIS — Z6841 Body Mass Index (BMI) 40.0 and over, adult: Secondary | ICD-10-CM | POA: Insufficient documentation

## 2014-12-16 DIAGNOSIS — Z713 Dietary counseling and surveillance: Secondary | ICD-10-CM | POA: Insufficient documentation

## 2014-12-16 NOTE — Patient Instructions (Addendum)
-  Work on preparing healthy meals for your family and modify yours (keep it simple!) -Make an appointment for a band fill -Follow up with Dr. Cyndia Skeeters -Include exercise in your routine starting in the fall   TANITA  BODY COMP RESULTS  11/18/13 12/17/13 01/20/14 03/24/14 07/01/14 08/14/14 10/16/14 12/16/14   BMI (kg/m^2) 53.4 50.6 47.9 45.9 44.9 43.7 45.3 45   Fat Mass (lbs) 179.5 158.0 146 139.5 128.5 133.5 141 138.5   Fat Free Mass (lbs) 141.5 146.0 142 136.5 141.5 129 131.5 132   Total Body Water (lbs) 103.5 107.0 104 100 103.5 94.5 96.5 96.5

## 2014-12-16 NOTE — Progress Notes (Signed)
Follow-up visit:  12 months Post-Operative LAGB Surgery  Medical Nutrition Therapy:  Appt start time: 830 end time: 915  Primary concerns today: Post-operative Bariatric Surgery Nutrition Management.   Dmiyah returns today having lost several pounds. She is excited to have lost weight because she has been traveling all summer and has not had a routine. However, she has been very active. She got to go to space camp and was excited that she fit in all of the devices. Feels like she needs a fill but needs to finish paying off her bill for cholecystectomy.   Surgery date: 12/03/13 Surgery type: LAGB Start weight at [facility]: 317.5 lbs on 09/07/13 (326 lbs per patient report) Weight today: 272.5 Weight change: 10 lbs gain Total weight lost: 53.5 lbs  TANITA  BODY COMP RESULTS  11/18/13 12/17/13 01/20/14 03/24/14 07/01/14 08/14/14 10/16/14 12/16/14   BMI (kg/m^2) 53.4 50.6 47.9 45.9 44.9 43.7 45.3 45   Fat Mass (lbs) 179.5 158.0 146 139.5 128.5 133.5 141 138.5   Fat Free Mass (lbs) 141.5 146.0 142 136.5 141.5 129 131.5 132   Total Body Water (lbs) 103.5 107.0 104 100 103.5 94.5 96.5 96.5    Preferred Learning Style:   No preference indicated  Learning Readiness:   Ready  24-hr recall: B (AM): coffee with protein shake (15-20g) Snk (AM):  Yogurt (12g) L (PM): tuna packet (14g) Snk (PM): cheese stick (7g) D (PM): Nachos OR breakfast casserole  Snk (PM):   Fluid intake: at least 64 oz of water (sometimes with flavor) Estimated total protein intake: 70+ grams per day  Medications: see list Supplementation: taking; Calcium inconsistent but drinks milk regularly and has a lot of cheese and yogurt  Using straws: occasionally, no gas pain Drinking while eating: no Hair loss: very little Carbonated beverages: 1-2 sips of diet soda  N/V/D/C: vomiting; having RUQ pain Dumping syndrome: no Last Lap-Band fill: 2 cc's on 12/27/2013  Recent physical activity:  none  Progress Towards Goal(s):  In  progress.   Nutritional Diagnosis:  [place]-3.3 Overweight/obesity related to past poor dietary habits and physical inactivity as evidenced by patient w/ recent LAGB surgery following dietary guidelines for continued weight loss.     Intervention:  Nutrition counseling provided.  Teaching Method Utilized: Visual Auditory  Barriers to learning/adherence to lifestyle change: none  Demonstrated degree of understanding via:  Teach Back   Monitoring/Evaluation:  Dietary intake, exercise, lap band fills, and body weight. Follow up in 4 months for 16 month post-op visit.

## 2015-02-18 ENCOUNTER — Ambulatory Visit: Payer: BC Managed Care – PPO | Admitting: Dietician

## 2015-06-29 IMAGING — RF DG CHOLANGIOGRAM OPERATIVE
1 series · 4 of 4 positions shown · non-contrast
Comparison: Ultrasound 07/07/2014

CLINICAL DATA: 38-year-old female with a history of cholecystectomy
and intraoperative cholangiogram

EXAM:
INTRAOPERATIVE CHOLANGIOGRAM
TECHNIQUE: Cholangiographic images from the C-arm fluoroscopic device were
submitted for interpretation post-operatively. Please see the
procedural report for the amount of contrast and the fluoroscopy
time utilized.

[Series 1: run · 4 of 73 frames shown]
[frame 11/73]
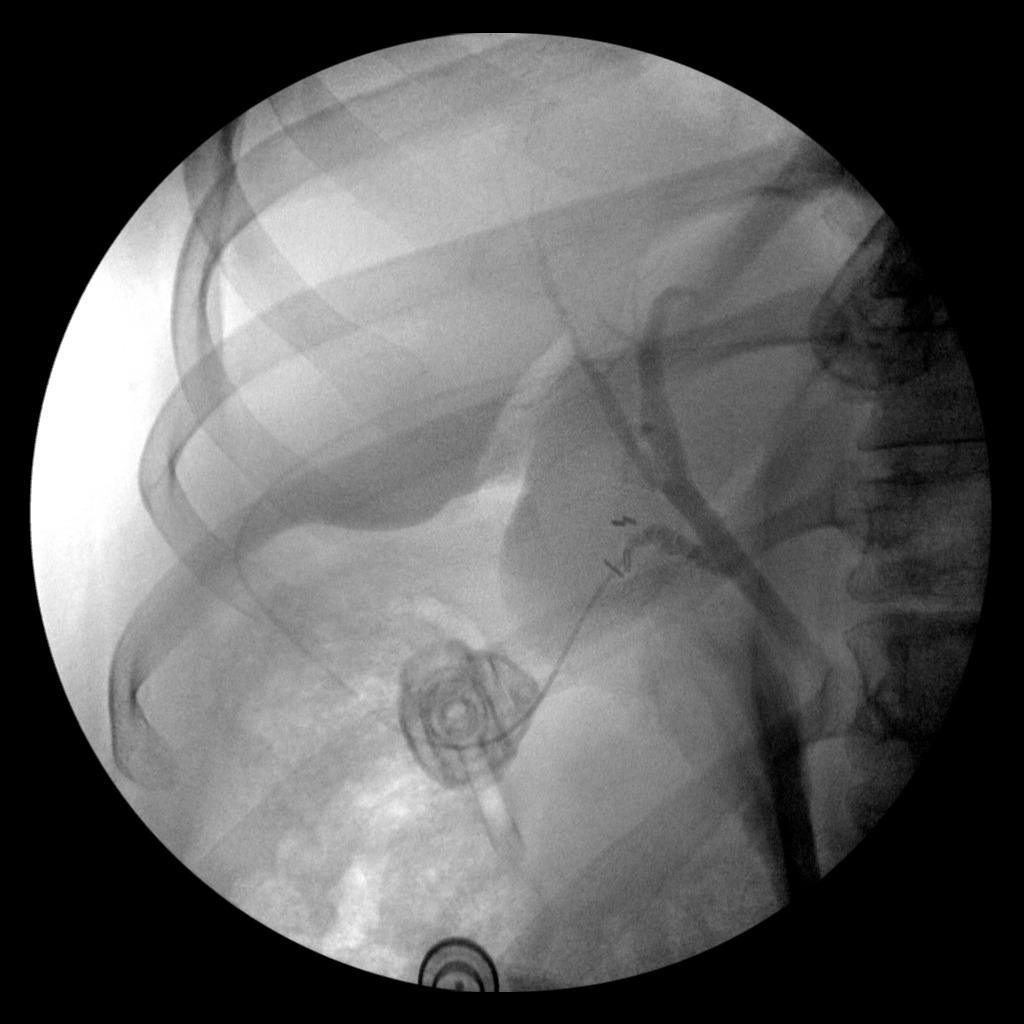
[frame 33/73]
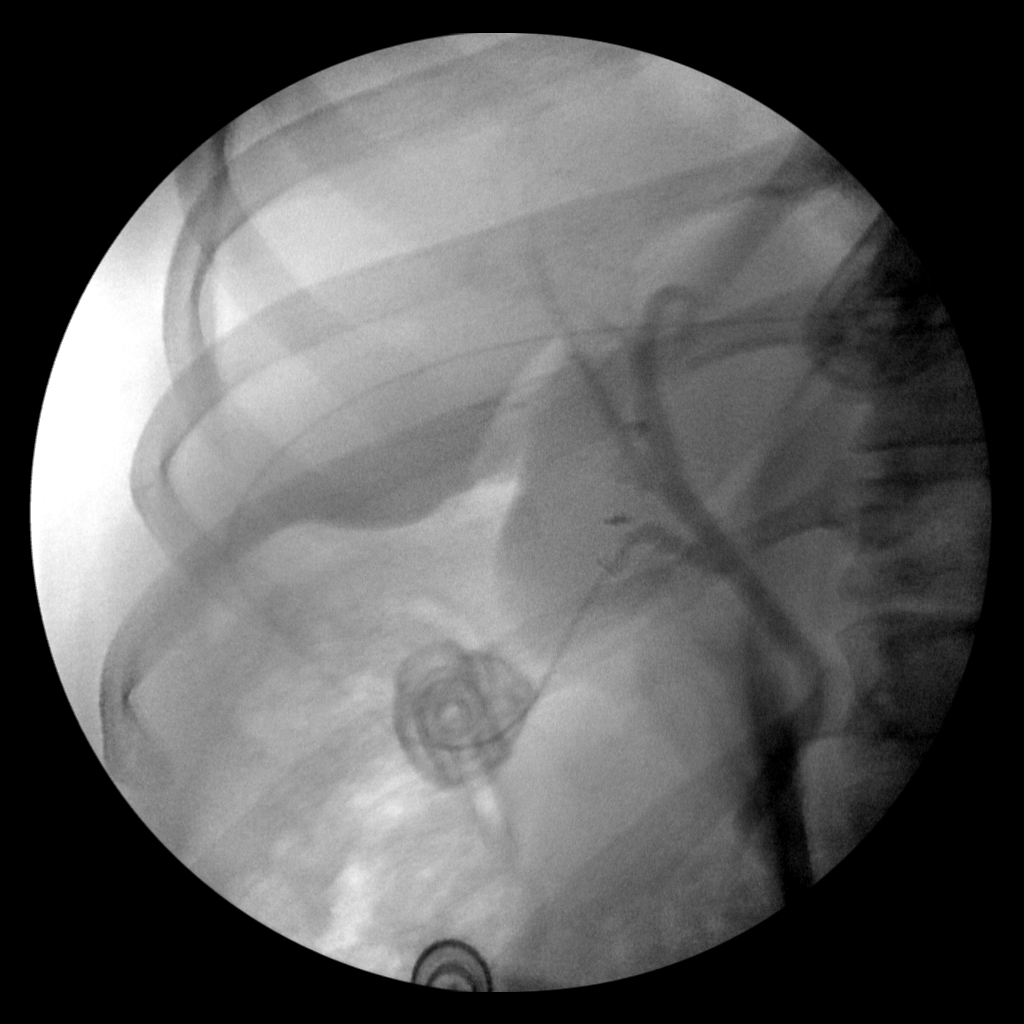
[frame 37/73]
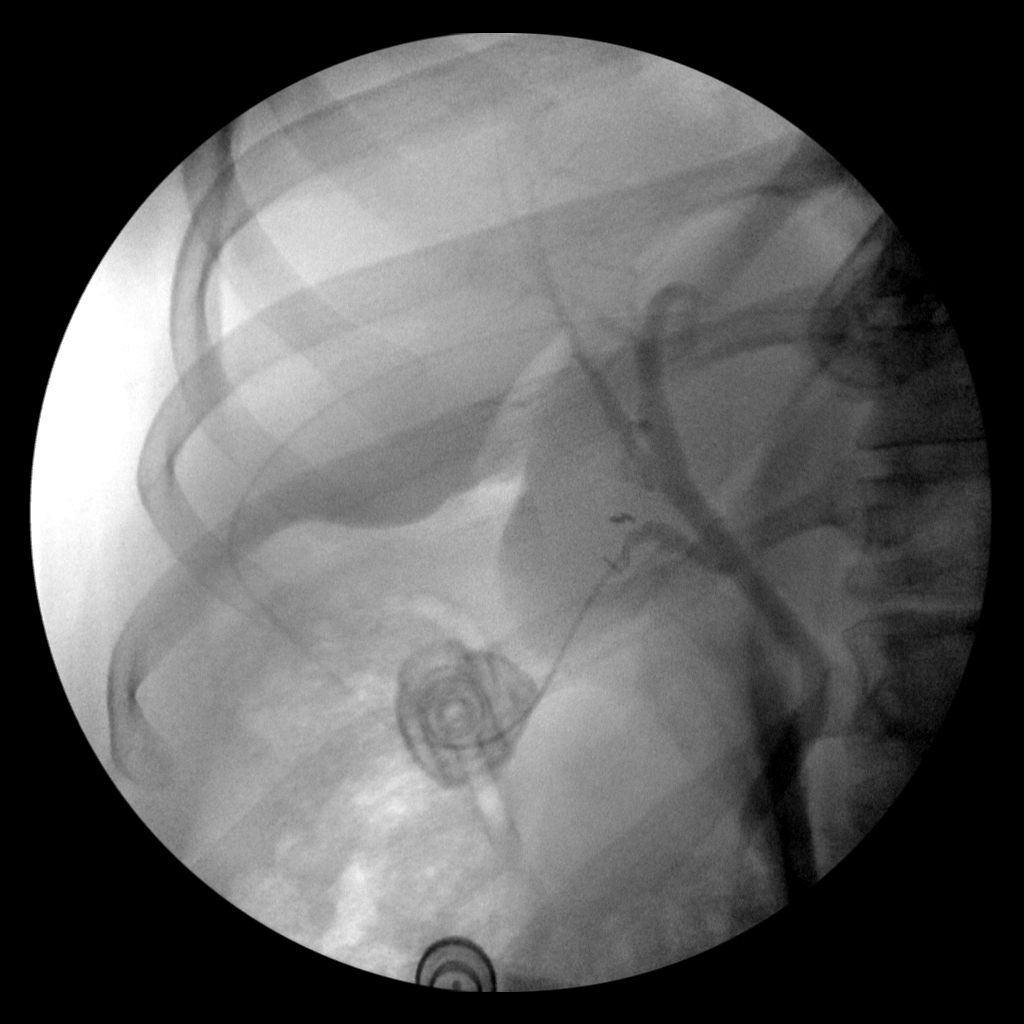
[frame 63/73]
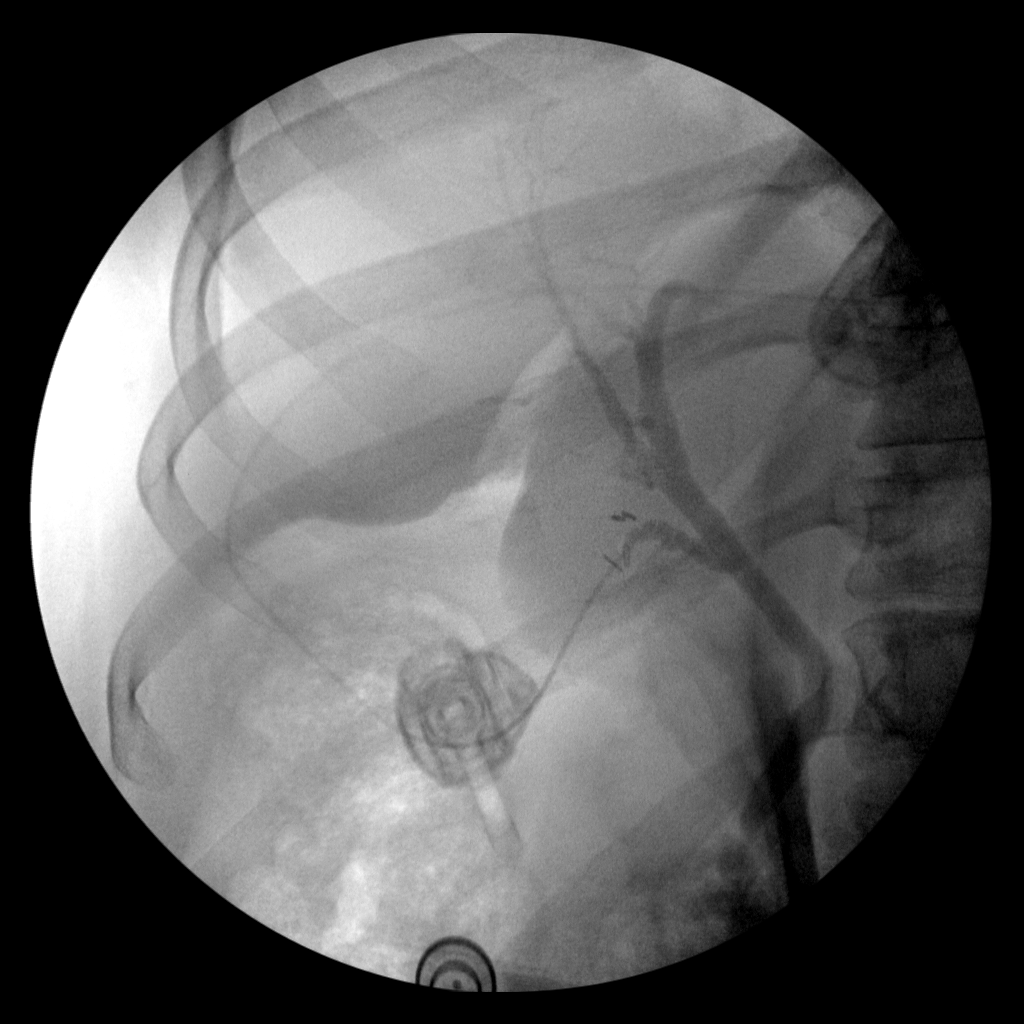

[4 of 4 positions shown; findings below may reference images not displayed]

FINDINGS: Limited fluoroscopic images of laparoscopic cholecystectomy.

Cannulation of the cystic duct near surgical clips of the upper
abdomen.

Antegrade infusion of contrast partially fills the extrahepatic
biliary ducts. Free flow contrast across the ampulla into the
duodenum.

No large filling defects.
IMPRESSION: Intraoperative cholangiogram demonstrates unremarkable caliber of
the extrahepatic biliary ducts and no large filling defects.
Contrast traverses the ampulla into the duodenum.

Please refer to the dictated operative report for full details of
intraoperative findings and procedure.

## 2016-02-03 ENCOUNTER — Encounter (HOSPITAL_COMMUNITY): Payer: Self-pay

## 2017-07-04 ENCOUNTER — Encounter (HOSPITAL_COMMUNITY): Payer: Self-pay

## 2021-04-18 ENCOUNTER — Other Ambulatory Visit: Payer: Self-pay

## 2021-04-18 ENCOUNTER — Ambulatory Visit (HOSPITAL_COMMUNITY)
Admission: EM | Admit: 2021-04-18 | Discharge: 2021-04-18 | Disposition: A | Discharge status: Home / Self Care | Payer: BC Managed Care – PPO | Attending: Emergency Medicine | Admitting: Emergency Medicine

## 2021-04-18 ENCOUNTER — Encounter (HOSPITAL_COMMUNITY): Payer: Self-pay | Admitting: Emergency Medicine

## 2021-04-18 DIAGNOSIS — J101 Influenza due to other identified influenza virus with other respiratory manifestations: Secondary | ICD-10-CM

## 2021-04-18 LAB — POC INFLUENZA A AND B ANTIGEN (URGENT CARE ONLY)
INFLUENZA A ANTIGEN, POC: POSITIVE — AB
INFLUENZA B ANTIGEN, POC: NEGATIVE

## 2021-04-18 MED ORDER — OSELTAMIVIR PHOSPHATE 75 MG PO CAPS: 75.0000 mg | ORAL_CAPSULE | Freq: Two times a day (BID) | ORAL | 0 refills | Status: AC

## 2021-04-18 MED ORDER — ACETAMINOPHEN 325 MG PO TABS
ORAL_TABLET | ORAL | Status: AC
  Filled 2021-04-18: qty 3

## 2021-04-18 MED ORDER — ACETAMINOPHEN 500 MG PO TABS
1000.0000 mg | ORAL_TABLET | Freq: Once | ORAL | Status: DC
  Administered 2021-04-18: 15:00:00 975 mg via ORAL

## 2021-04-18 NOTE — ED Triage Notes (Signed)
Pt reports sore throat, cough, congestion, ear feels stuffed, chills, sweats, fevers.

## 2021-04-18 NOTE — ED Provider Notes (Signed)
[facility]    CSN: [id number] Arrival date & time: 04/18/21  1146      History   Chief Complaint Chief Complaint  Patient presents with   Cough   Sore Throat    HPI Sherry Gordon is a 45 y.o. female.   HPI Sherry Gordon is a 45 y.o. female presenting to UC with c/o sudden onset sore throat, cough, congestion, ear fullness, chills, and sweats that started early this morning/late last night.  Mild body aches.  Denies chest pain, SOB, or palpitations.  Pt works at an [employer] and usually gets the flu vaccine but work has not come around to offer it yet.  She notes her son had a mild cold last week but was not sick enough to be evaluated by a medical provider.    Past Medical History:  Diagnosis Date   Anxiety    Corneal abrasion, left    "corneal scatch" not a problem in last 6 months   Depression    H/O seasonal allergies    carries an Inhaler, never used - uses OTC cetirizine   H/O seasonal allergies    History of kidney stones    x2 no recent problems   IUD (intrauterine device) in place    11-26-13 Mirena    Patient Active Problem List   Diagnosis Date Noted   Hx of laparoscopic adjustable gastric banding 12/03/13 12/27/2013   Morbid obesity with BMI of 50.0-59.9, adult (HCC) 08/01/2013    Past Surgical History:  Procedure Laterality Date   CHOLECYSTECTOMY N/A 08/01/2014   Procedure: LAPAROSCOPIC CHOLECYSTECTOMY WITH INTRAOPERATIVE CHOLANGIOGRAM;  Surgeon: Gaynelle Adu, MD;  Location: [facility];  Service: General;  Laterality: N/A;   LAPAROSCOPIC GASTRIC BANDING N/A 12/03/2013   Procedure: LAPAROSCOPIC ADJUSTABLE GASTRIC BAND PLACEMENT;  Surgeon: [name], MD;  Location: [facility];  Service: General;  Laterality: N/A;   TONSILLECTOMY  1985   age 42    OB History   No obstetric history on file.      Home Medications    Prior to Admission medications   Medication Sig Start Date End Date Taking? Authorizing Provider  oseltamivir (TAMIFLU) 75  MG capsule Take 1 capsule (75 mg total) by mouth every 12 (twelve) hours. 04/18/21  Yes Oona Trammel O, PA-C  amoxicillin (AMOXIL) 500 MG capsule Take 500 mg by mouth 3 (three) times daily.    [provider]  calcium citrate-vitamin D (CITRACAL+D) 315-200 MG-UNIT per tablet Take 1 tablet by mouth daily with lunch.    [provider]  oxyCODONE (OXY IR/ROXICODONE) 5 MG immediate release tablet Take 1-2 tablets (5-10 mg total) by mouth every 4 (four) hours as needed for moderate pain, severe pain or breakthrough pain. 08/01/14   Gaynelle Adu, MD  venlafaxine XR (EFFEXOR-XR) 75 MG 24 hr capsule Take 75 mg by mouth every morning.  07/20/13   [provider]    Family History No family history on file.  Social History Social History   Tobacco Use   Smoking status: Former    Packs/day: 0.50    Years: 5.00    Pack years: 2.50    Types: Cigarettes    Quit date: 11/26/2008    Years since quitting: 12.4   Smokeless tobacco: Never  Substance Use Topics   Alcohol use: Yes    Comment: occ   Drug use: No     Allergies   Tape   Review of Systems Review of Systems  Constitutional:  Positive for chills and fever.  HENT:  Positive for congestion, ear pain and sore throat. Negative for trouble swallowing and voice change.   Respiratory:  Positive for cough. Negative for shortness of breath.   Cardiovascular:  Negative for chest pain and palpitations.  Gastrointestinal:  Negative for abdominal pain, diarrhea, nausea and vomiting.  Musculoskeletal:  Positive for arthralgias and myalgias.  Skin:  Negative for rash.  Neurological:  Positive for headaches. Negative for dizziness, syncope and numbness.  All other systems reviewed and are negative.   Physical Exam Triage Vital Signs ED Triage Vitals  Enc Vitals Group     BP 04/18/21 1419 108/69     Pulse Rate 04/18/21 1419 (!) 127     Resp 04/18/21 1419 18     Temp 04/18/21 1419 (!) 101 F (38.3 C)     Temp Source  04/18/21 1419 Oral     SpO2 04/18/21 1419 97 %     Weight --      Height --      Head Circumference --      Peak Flow --      Pain Score 04/18/21 1418 4     Pain Loc --      Pain Edu? --      Excl. in GC? --    No data found.  Updated Vital Signs BP 108/69 (BP Location: Right Arm)    Pulse (!) 120    Temp (!) 101.4 F (38.6 C) (Oral)    Resp 18    SpO2 97%   Visual Acuity Right Eye Distance:   Left Eye Distance:   Bilateral Distance:    Right Eye Near:   Left Eye Near:    Bilateral Near:     Physical Exam Vitals and nursing note reviewed.  Constitutional:      General: She is not in acute distress.    Appearance: She is well-developed. She is not ill-appearing, toxic-appearing or diaphoretic.  HENT:     Head: Normocephalic and atraumatic.     Right Ear: Tympanic membrane and ear canal normal.     Left Ear: Tympanic membrane and ear canal normal.     Nose: Congestion present.     Right Sinus: No maxillary sinus tenderness or frontal sinus tenderness.     Left Sinus: No maxillary sinus tenderness or frontal sinus tenderness.     Mouth/Throat:     Lips: Pink.     Mouth: Mucous membranes are moist.     Pharynx: Oropharynx is clear. Uvula midline. Posterior oropharyngeal erythema present. No pharyngeal swelling, oropharyngeal exudate or uvula swelling.  Cardiovascular:     Rate and Rhythm: Regular rhythm. Tachycardia present.  Pulmonary:     Effort: Pulmonary effort is normal. No respiratory distress.     Breath sounds: Normal breath sounds. No stridor. No wheezing, rhonchi or rales.  Musculoskeletal:        General: Normal range of motion.     Cervical back: Normal range of motion and neck supple.  Lymphadenopathy:     Cervical: No cervical adenopathy.  Skin:    General: Skin is warm and dry.  Neurological:     Mental Status: She is alert and oriented to person, place, and time.  Psychiatric:        Behavior: Behavior normal.     UC Treatments / Results   Labs (all labs ordered are listed, but only abnormal results are displayed) Labs Reviewed  POC INFLUENZA A AND B ANTIGEN (  URGENT CARE ONLY) - Abnormal; Notable for the following components:      Result Value   INFLUENZA A ANTIGEN, POC POSITIVE (*)    All other components within normal limits    EKG   Radiology No results found.  Procedures Procedures (including critical care time)  Medications Ordered in UC Medications - No data to display  Initial Impression / Assessment and Plan / UC Course  I have reviewed the triage vital signs and the nursing notes.  Pertinent labs & imaging results that were available during my care of the patient were reviewed by me and considered in my medical decision making (see chart for details).    HR elevated, likely due to elevated temp. Acetaminophen given in UC No respiratory distress. Pt appears well, non-toxic.  Rapid Flu: POSITIVE Influenza A No evidence of underlying bacterial infection at this time Rx: Tamiflu AVS and work note provided F/u with PCP as needed  Final Clinical Impressions(s) / UC Diagnoses   Final diagnoses:  Influenza A     Discharge Instructions       Oseltamivir (Tamiflu) may cause stomach upset including nausea, vomiting and diarrhea.  It may also cause dizziness or hallucinations in children.  To help prevent stomach upset, you may take this medication with food.  If you are still having unwanted symptoms, you may stop taking this medication as it is not as important to finish the entire course like antibiotics.  If you have questions/concerns please call our office or follow up with your primary care provider.         ED Prescriptions     Medication Sig Dispense Auth. Provider   oseltamivir (TAMIFLU) 75 MG capsule Take 1 capsule (75 mg total) by mouth every 12 (twelve) hours. 10 capsule Lurene Shadow, PA-C      PDMP not reviewed this encounter.   Lurene Shadow, [place] 04/18/21 1519

## 2021-04-18 NOTE — Discharge Instructions (Signed)
°  Oseltamivir (Tamiflu) may cause stomach upset including nausea, vomiting and diarrhea.  It may also cause dizziness or hallucinations in children.  To help prevent stomach upset, you may take this medication with food.  If you are still having unwanted symptoms, you may stop taking this medication as it is not as important to finish the entire course like antibiotics.  If you have questions/concerns please call our office or follow up with your primary care provider.
